# Patient Record
Sex: Male | Born: 1985
Health system: Southern US, Community
[De-identification: ages and names within clinical notes are randomized; demographics above are authoritative.]

## PROBLEM LIST (undated history)

## (undated) DIAGNOSIS — E785 Hyperlipidemia, unspecified: Secondary | ICD-10-CM

## (undated) DIAGNOSIS — E119 Type 2 diabetes mellitus without complications: Secondary | ICD-10-CM

## (undated) DIAGNOSIS — T7840XA Allergy, unspecified, initial encounter: Secondary | ICD-10-CM

## (undated) HISTORY — DX: Hyperlipidemia, unspecified: E78.5

## (undated) HISTORY — DX: Allergy, unspecified, initial encounter: T78.40XA

---
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
## 2006-03-07 ENCOUNTER — Ambulatory Visit: Payer: Self-pay | Admitting: Pediatrics

## 2010-04-08 ENCOUNTER — Ambulatory Visit
Admission: RE | Admit: 2010-04-08 | Discharge: 2010-04-08 | Disposition: A | Discharge status: Home / Self Care | Payer: No Typology Code available for payment source | Source: Ambulatory Visit | Attending: Occupational Medicine | Admitting: Occupational Medicine

## 2010-04-08 ENCOUNTER — Other Ambulatory Visit: Payer: Self-pay | Admitting: Occupational Medicine

## 2010-04-08 DIAGNOSIS — Z021 Encounter for pre-employment examination: Secondary | ICD-10-CM

## 2010-11-15 ENCOUNTER — Other Ambulatory Visit: Payer: Self-pay | Admitting: Occupational Medicine

## 2010-11-15 ENCOUNTER — Ambulatory Visit: Payer: Self-pay

## 2010-11-15 DIAGNOSIS — R52 Pain, unspecified: Secondary | ICD-10-CM

## 2012-03-26 ENCOUNTER — Encounter (HOSPITAL_COMMUNITY): Payer: Self-pay | Admitting: Emergency Medicine

## 2012-03-26 ENCOUNTER — Emergency Department (HOSPITAL_COMMUNITY)
Admission: EM | Admit: 2012-03-26 | Discharge: 2012-03-26 | Disposition: A | Discharge status: Home / Self Care | Payer: Worker's Compensation | Attending: Emergency Medicine | Admitting: Emergency Medicine

## 2012-03-26 DIAGNOSIS — Y9289 Other specified places as the place of occurrence of the external cause: Secondary | ICD-10-CM | POA: Insufficient documentation

## 2012-03-26 DIAGNOSIS — S0093XA Contusion of unspecified part of head, initial encounter: Secondary | ICD-10-CM

## 2012-03-26 DIAGNOSIS — W009XXA Unspecified fall due to ice and snow, initial encounter: Secondary | ICD-10-CM

## 2012-03-26 DIAGNOSIS — S1093XA Contusion of unspecified part of neck, initial encounter: Secondary | ICD-10-CM | POA: Insufficient documentation

## 2012-03-26 DIAGNOSIS — W010XXA Fall on same level from slipping, tripping and stumbling without subsequent striking against object, initial encounter: Secondary | ICD-10-CM | POA: Insufficient documentation

## 2012-03-26 DIAGNOSIS — Y9389 Activity, other specified: Secondary | ICD-10-CM | POA: Insufficient documentation

## 2012-03-26 DIAGNOSIS — S0003XA Contusion of scalp, initial encounter: Secondary | ICD-10-CM | POA: Insufficient documentation

## 2012-03-26 DIAGNOSIS — S0083XA Contusion of other part of head, initial encounter: Secondary | ICD-10-CM | POA: Insufficient documentation

## 2012-03-26 DIAGNOSIS — Y990 Civilian activity done for income or pay: Secondary | ICD-10-CM | POA: Insufficient documentation

## 2012-03-26 NOTE — ED Notes (Signed)
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

## 2012-03-26 NOTE — ED Provider Notes (Signed)
History   This chart was scribed for non-physician practitioner working with Francisco Andrews, by Gerlean Ren, ED Scribe. This patient was seen in room TR07C/TR07C and the patient's care was started at 7:56 PM.    CSN: [id number]  Arrival date & time 03/26/12  1946   None     No chief complaint on file.    The history is provided by the patient. No language interpreter was used.   Mason Dibiasio is a 27 y.o. male who presents to the Emergency Department complaining of constant, non-changing, non-radiating aching HA and posterior head soreness after slipping and falling backwards making head impact with pavement while working.  Pt states he did not lose consciousness but he "saw a bright light" at the moment of impact but denies any lasting visual disturbances.  Pt states pain is bearable and less severe than he is used to with h/o migraines.  Pt denies neck pain, shoulder pain, back pain.  Pt denies tobacco and alcohol use.  No past medical history on file.  No past surgical history on file.  No family history on file.  History  Substance Use Topics  . Smoking status: Never Smoker   . Smokeless tobacco: Not on file  . Alcohol Use: No      Review of Systems  HENT: Negative for neck pain.   Eyes: Negative for visual disturbance.  Gastrointestinal: Negative for nausea and vomiting.  Musculoskeletal: Negative for back pain.  Neurological: Positive for headaches.  All other systems reviewed and are negative.    Allergies  Review of patient's allergies indicates not on file.  Home Medications  No current outpatient prescriptions on file.  BP 166/88  Pulse 101  Temp 98.7 F (37.1 C) (Oral)  Resp 20  SpO2 96%  Physical Exam  Nursing note and vitals reviewed. Constitutional: He is oriented to person, place, and time. He appears well-developed and well-nourished. No distress.  HENT:  Head: Normocephalic and atraumatic.       No contusions, no swelling, no  noticeable injury  Eyes: EOM are normal. Pupils are equal, round, and reactive to light.  Neck: Neck supple. No tracheal deviation present.  Cardiovascular: Normal rate, regular rhythm and normal heart sounds.   Pulmonary/Chest: Effort normal and breath sounds normal. No respiratory distress. He has no wheezes.  Musculoskeletal: Normal range of motion.  Neurological: He is alert and oriented to person, place, and time. No cranial nerve deficit.  Skin: Skin is warm and dry.  Psychiatric: He has a normal mood and affect. His behavior is normal.    ED Course  Procedures (including critical care time) DIAGNOSTIC STUDIES: Oxygen Saturation is 96% on room air, adequate by my interpretation.    COORDINATION OF CARE: 8:01 PM- Informed pt to return if HA worsens or if nausea/emesis begin.  Discussed discharge.  Pt understands and agrees.  No diagnosis found.  Slipped, fell on ice, landing on back (wearing ballistic vest), struck head on pavement.  No loss of consciousness.  Neuro exam grossly normal.  Mild occipital headache, no obvious external injury noted.  Discharged home with head injury and return precautions.  MDM    I personally performed the services described in this documentation, which was scribed in my presence. The recorded information has been reviewed and is accurate.        Jimmye Norman, NP 03/26/12 2017

## 2012-03-26 NOTE — ED Notes (Addendum)
NP Francisco Andrews at bedside. C Collar removed by NP. Pt denies neck pain. Reports 8/10 HA. Denies blurred vision. Denies LOC. No deformity or trauma noted to back of head.

## 2012-03-26 NOTE — ED Provider Notes (Signed)
Medical screening examination/treatment/procedure(s) were performed by non-physician practitioner and as supervising physician I was immediately available for consultation/collaboration.   Gilda Crease, MD 03/26/12 [phone]

## 2013-05-08 ENCOUNTER — Encounter (HOSPITAL_COMMUNITY): Payer: Self-pay | Admitting: Emergency Medicine

## 2013-05-08 ENCOUNTER — Emergency Department (INDEPENDENT_AMBULATORY_CARE_PROVIDER_SITE_OTHER)
Admission: EM | Admit: 2013-05-08 | Discharge: 2013-05-08 | Disposition: A | Discharge status: Home / Self Care | Payer: 59 | Source: Home / Self Care | Attending: Family Medicine | Admitting: Family Medicine

## 2013-05-08 DIAGNOSIS — S61219A Laceration without foreign body of unspecified finger without damage to nail, initial encounter: Secondary | ICD-10-CM

## 2013-05-08 DIAGNOSIS — S61209A Unspecified open wound of unspecified finger without damage to nail, initial encounter: Secondary | ICD-10-CM | POA: Diagnosis not present

## 2013-05-08 NOTE — ED Notes (Signed)
PA notified that I did not see order for recheck of BP, until after pt. had left.  She said it was OK.

## 2013-05-08 NOTE — ED Provider Notes (Signed)
Medical screening examination/treatment/procedure(s) were performed by resident physician or non-physician practitioner and as supervising physician I was immediately available for consultation/collaboration.   KINDL,JAMES DOUGLAS MD.   James D Kindl, MD 05/08/13 2047 

## 2013-05-08 NOTE — Discharge Instructions (Signed)
Tissue Adhesive Wound Care °Some cuts, wounds, lacerations, and incisions can be repaired by using tissue adhesive. Tissue adhesive is like glue. It holds the skin together, allowing for faster healing. It forms a strong bond on the skin in about 1 minute and reaches its full strength in about 2 or 3 minutes. The adhesive disappears naturally while the wound is healing. It is important to take proper care of your wound at home while it heals.  °HOME CARE INSTRUCTIONS  °· Showers are allowed. Do not soak the area containing the tissue adhesive. Do not take baths, swim, or use hot tubs. Do not use any soaps or ointments on the wound. Certain ointments can weaken the glue. °· If a bandage (dressing) has been applied, follow your health care provider's instructions for how often to change the dressing.   °· Keep the dressing dry if one has been applied.   °· Do not scratch, pick, or rub the adhesive.   °· Do not place tape over the adhesive. The adhesive could come off when pulling the tape off.   °· Protect the wound from further injury until it is healed.   °· Protect the wound from sun and tanning bed exposure while it is healing and for several weeks after healing.   °· Only take over-the-counter or prescription medicines as directed by your health care provider.   °· Keep all follow-up appointments as directed by your health care provider. °SEEK IMMEDIATE MEDICAL CARE IF:  °· Your wound becomes red, swollen, hot, or tender.   °· You develop a rash after the glue is applied. °· You have increasing pain in the wound.   °· You have a red streak that goes away from the wound.   °· You have pus coming from the wound.   °· You have increased bleeding. °· You have a fever. °· You have shaking chills.   °· You notice a bad smell coming from the wound.   °· Your wound or adhesive breaks open.   °MAKE SURE YOU:  °· Understand these instructions. °· Will watch your condition. °· Will get help right away if you are not doing  well or get worse. °Document Released: 08/09/2000 Document Revised: 12/04/2012 Document Reviewed: 09/04/2012 °Exit[employer]® Patient Information ©2014 [employer]. ° °
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
## 2013-05-08 NOTE — ED Provider Notes (Signed)
CSN: [id number]     Arrival date & time 05/08/13  1929 History   First MD Initiated Contact with Patient 05/08/13 1944     Chief Complaint  Patient presents with  . Extremity Laceration   (Consider location/radiation/quality/duration/timing/severity/associated sxs/prior Treatment) Patient is a 28 y.o. male presenting with skin laceration. The history is provided by the patient. No language interpreter was used.  Laceration Location:  Hand Hand laceration location:  R finger Length (cm):  0.3 Depth:  Through dermis Laceration mechanism:  Metal edge Pain details:    Quality:  Aching   Severity:  No pain Foreign body present:  No foreign bodies Relieved by:  Pressure Worsened by:  Nothing tried Ineffective treatments:  None tried Tetanus status:  Up to date   Past Medical History  Diagnosis Date  . Hypertension    History reviewed. No pertinent past surgical history. History reviewed. No pertinent family history. History  Substance Use Topics  . Smoking status: Current Some Day Smoker    Types: Cigars  . Smokeless tobacco: Not on file  . Alcohol Use: No    Review of Systems  Skin: Positive for wound.  All other systems reviewed and are negative.    Allergies  Biaxin; Penicillins; and Sulfa antibiotics  Home Medications   Current Outpatient Rx  Name  Route  Sig  Dispense  Refill  . fexofenadine (ALLEGRA) 180 MG tablet   Oral   Take 180 mg by mouth daily.          BP 160/101  Pulse 115  Temp(Src) 98 F (36.7 C) (Oral)  Resp 18  SpO2 96% Physical Exam  Nursing note and vitals reviewed. Constitutional: He is oriented to person, place, and time. He appears well-developed and well-nourished.  Musculoskeletal:  3mm superficial laceration   Neurological: He is alert and oriented to person, place, and time. He has normal reflexes.  Skin: Skin is warm.  Psychiatric: He has a normal mood and affect.    ED Course  LACERATION REPAIR Date/Time: 05/08/2013  8:24 PM Performed by: Cheron SchaumannSOFIA, Evalie Hargraves K Authorized by: Clementeen GrahamOREY, EVAN, S Risks and benefits: risks, benefits and alternatives were discussed Required items: required blood products, implants, devices, and special equipment available Body area: upper extremity Location details: right index finger Laceration length: 0.3 cm Foreign bodies: no foreign bodies Tendon involvement: none Nerve involvement: none Skin closure: glue Patient tolerance: Patient tolerated the procedure well with no immediate complications.   (including critical care time) Labs Review Labs Reviewed - No data to display Imaging Review No results found.   MDM   1. Laceration of finger       Elson AreasLeslie K Krystalynn Ridgeway, [place] JerseyPA-C 05/08/13 2028

## 2013-07-21 ENCOUNTER — Encounter (HOSPITAL_COMMUNITY): Payer: Self-pay | Admitting: Emergency Medicine

## 2013-07-21 ENCOUNTER — Emergency Department (HOSPITAL_COMMUNITY)
Admission: EM | Admit: 2013-07-21 | Discharge: 2013-07-21 | Disposition: A | Discharge status: Home / Self Care | Payer: Worker's Compensation | Attending: Emergency Medicine | Admitting: Emergency Medicine

## 2013-07-21 DIAGNOSIS — Y9389 Activity, other specified: Secondary | ICD-10-CM | POA: Insufficient documentation

## 2013-07-21 DIAGNOSIS — T5894XA Toxic effect of carbon monoxide from unspecified source, undetermined, initial encounter: Secondary | ICD-10-CM | POA: Insufficient documentation

## 2013-07-21 DIAGNOSIS — F17200 Nicotine dependence, unspecified, uncomplicated: Secondary | ICD-10-CM | POA: Insufficient documentation

## 2013-07-21 DIAGNOSIS — I10 Essential (primary) hypertension: Secondary | ICD-10-CM | POA: Insufficient documentation

## 2013-07-21 DIAGNOSIS — Z880 Allergy status to penicillin: Secondary | ICD-10-CM | POA: Insufficient documentation

## 2013-07-21 DIAGNOSIS — Z7729 Contact with and (suspected ) exposure to other hazardous substances: Secondary | ICD-10-CM

## 2013-07-21 DIAGNOSIS — Y9289 Other specified places as the place of occurrence of the external cause: Secondary | ICD-10-CM | POA: Insufficient documentation

## 2013-07-21 DIAGNOSIS — T5801XA Toxic effect of carbon monoxide from motor vehicle exhaust, accidental (unintentional), initial encounter: Secondary | ICD-10-CM | POA: Insufficient documentation

## 2013-07-21 DIAGNOSIS — Z79899 Other long term (current) drug therapy: Secondary | ICD-10-CM | POA: Insufficient documentation

## 2013-07-21 LAB — CARBOXYHEMOGLOBIN
Carboxyhemoglobin: 1.5 % (ref 0.5–1.5)
Methemoglobin: 1 % (ref 0.0–1.5)
O2 Saturation: 98.4 %
Total hemoglobin: 15.9 g/dL (ref 13.5–18.0)
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
## 2013-07-21 NOTE — ED Provider Notes (Signed)
Medical screening examination/treatment/procedure(s) were performed by non-physician practitioner and as supervising physician I was immediately available for consultation/collaboration.     Kalief Kattner, MD 07/21/13 0636 
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
## 2013-07-21 NOTE — Discharge Instructions (Signed)
Carbon Monoxide Poisoning Carbon monoxide poisoning is sickness from breathing in carbon monoxide gas. This is an emergency. You need to get medical help right away.  HOME CARE Do not return to the area where you breathed in the gas. The area must be completely aired out (ventilated) before it is safe to return. To prevent sickness in the future:  Put a carbon monoxide detector in your home.  Have all gas stoves and furnaces checked once a year.  Clean all fireplace chimneys and flues at least once a year.  Have the exhaust system in your car checked once a year.  Never keep a car's motor running in a closed garage.  Never sleep in a car with the motor running.  Make sure all rooms that are heated with gas, coal, charcoal, or wood are aired out well. GET HELP RIGHT AWAY IF: You think a person has breathed in carbon monoxide gas. Remove the person from the area right away. Call your local emergency services (911 in [place] Begin rescue breathing if you cannot wake up the person (unconscious). MAKE SURE YOU:  Understand these instructions.  Will watch your condition.  Will get help right away if you are not doing well or get worse. Document Released: 08/15/2011 Document Reviewed: 08/15/2011 [facility] Patient Information 2014 [place], [place].

## 2017-05-20 ENCOUNTER — Emergency Department (HOSPITAL_COMMUNITY)
Admission: EM | Admit: 2017-05-20 | Discharge: 2017-05-20 | Disposition: A | Discharge status: Home / Self Care | Payer: Worker's Compensation | Attending: Emergency Medicine | Admitting: Emergency Medicine

## 2017-05-20 ENCOUNTER — Emergency Department (HOSPITAL_COMMUNITY): Payer: Worker's Compensation

## 2017-05-20 DIAGNOSIS — S6991XA Unspecified injury of right wrist, hand and finger(s), initial encounter: Secondary | ICD-10-CM | POA: Diagnosis present

## 2017-05-20 DIAGNOSIS — Y999 Unspecified external cause status: Secondary | ICD-10-CM | POA: Insufficient documentation

## 2017-05-20 DIAGNOSIS — S60221A Contusion of right hand, initial encounter: Secondary | ICD-10-CM | POA: Diagnosis not present

## 2017-05-20 DIAGNOSIS — Y929 Unspecified place or not applicable: Secondary | ICD-10-CM | POA: Insufficient documentation

## 2017-05-20 DIAGNOSIS — Y9389 Activity, other specified: Secondary | ICD-10-CM | POA: Insufficient documentation

## 2017-05-20 DIAGNOSIS — W228XXA Striking against or struck by other objects, initial encounter: Secondary | ICD-10-CM | POA: Insufficient documentation

## 2017-05-20 NOTE — ED Notes (Signed)
XR at bedside
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
## 2017-05-20 NOTE — Discharge Instructions (Signed)
It was my pleasure taking care of you today!   Fortunately, your x-ray was negative today.  Alternate between Tylenol and ibuprofen as needed for pain.  Ice to affected area can help with pain as well.  Follow-up with the hand surgeon if symptoms are not improving in 1 week.  Return to ER for new or worsening symptoms, any additional concerns.

## 2017-09-24 ENCOUNTER — Encounter (HOSPITAL_COMMUNITY): Payer: Self-pay | Admitting: *Deleted

## 2017-09-24 ENCOUNTER — Other Ambulatory Visit: Payer: Self-pay

## 2017-09-24 ENCOUNTER — Emergency Department (HOSPITAL_COMMUNITY)
Admission: EM | Admit: 2017-09-24 | Discharge: 2017-09-24 | Disposition: A | Discharge status: Home / Self Care | Payer: 59 | Attending: Emergency Medicine | Admitting: Emergency Medicine

## 2017-09-24 DIAGNOSIS — R739 Hyperglycemia, unspecified: Secondary | ICD-10-CM | POA: Insufficient documentation

## 2017-09-24 DIAGNOSIS — Z7982 Long term (current) use of aspirin: Secondary | ICD-10-CM | POA: Diagnosis not present

## 2017-09-24 DIAGNOSIS — R0902 Hypoxemia: Secondary | ICD-10-CM | POA: Diagnosis not present

## 2017-09-24 DIAGNOSIS — G43009 Migraine without aura, not intractable, without status migrainosus: Secondary | ICD-10-CM | POA: Diagnosis not present

## 2017-09-24 DIAGNOSIS — F17200 Nicotine dependence, unspecified, uncomplicated: Secondary | ICD-10-CM | POA: Insufficient documentation

## 2017-09-24 DIAGNOSIS — R110 Nausea: Secondary | ICD-10-CM | POA: Diagnosis not present

## 2017-09-24 DIAGNOSIS — R000 Tachycardia, unspecified: Secondary | ICD-10-CM | POA: Diagnosis not present

## 2017-09-24 DIAGNOSIS — G4489 Other headache syndrome: Secondary | ICD-10-CM | POA: Diagnosis not present

## 2017-09-24 DIAGNOSIS — R51 Headache: Secondary | ICD-10-CM | POA: Diagnosis present

## 2017-09-24 LAB — CBC WITH DIFFERENTIAL/PLATELET
Basophils Absolute: 0.1 10*3/uL (ref 0.0–0.1)
Basophils Relative: 1 %
Eosinophils Absolute: 0.1 10*3/uL (ref 0.0–0.7)
Eosinophils Relative: 1 %
HCT: 49.2 % (ref 39.0–52.0)
Hemoglobin: 17.8 g/dL — ABNORMAL HIGH (ref 13.0–17.0)
Lymphocytes Relative: 31 %
Lymphs Abs: 2.3 10*3/uL (ref 0.7–4.0)
MCH: 31 pg (ref 26.0–34.0)
MCHC: 36.2 g/dL — ABNORMAL HIGH (ref 30.0–36.0)
MCV: 85.6 fL (ref 78.0–100.0)
Monocytes Absolute: 0.4 10*3/uL (ref 0.1–1.0)
Monocytes Relative: 6 %
Neutro Abs: 4.5 10*3/uL (ref 1.7–7.7)
Neutrophils Relative %: 61 %
Platelets: 273 10*3/uL (ref 150–400)
RBC: 5.75 MIL/uL (ref 4.22–5.81)
RDW: 11.8 % (ref 11.5–15.5)
WBC: 7.3 10*3/uL (ref 4.0–10.5)

## 2017-09-24 LAB — BASIC METABOLIC PANEL
Anion gap: 15 (ref 5–15)
Anion gap: 11 (ref 5–15)
BUN: 13 mg/dL (ref 6–20)
BUN: 12 mg/dL (ref 6–20)
CO2: 27 mmol/L (ref 22–32)
CO2: 25 mmol/L (ref 22–32)
Calcium: 9.7 mg/dL (ref 8.9–10.3)
Calcium: 8.2 mg/dL — ABNORMAL LOW (ref 8.9–10.3)
Chloride: 94 mmol/L — ABNORMAL LOW (ref 98–111)
Chloride: 101 mmol/L (ref 98–111)
Creatinine, Ser: 0.79 mg/dL (ref 0.61–1.24)
Creatinine, Ser: 0.69 mg/dL (ref 0.61–1.24)
GFR calc Af Amer: >60 mL/min (ref >60)
GFR calc Af Amer: >60 mL/min (ref >60)
GFR calc non Af Amer: >60 mL/min (ref >60)
GFR calc non Af Amer: >60 mL/min (ref >60)
Glucose, Bld: 372 mg/dL — ABNORMAL HIGH (ref 70–99)
Glucose, Bld: 294 mg/dL — ABNORMAL HIGH (ref 70–99)
Potassium: 4.5 mmol/L (ref 3.5–5.1)
Potassium: 3.8 mmol/L (ref 3.5–5.1)
Sodium: 136 mmol/L (ref 135–145)
Sodium: 137 mmol/L (ref 135–145)

## 2017-09-24 LAB — URINALYSIS, ROUTINE W REFLEX MICROSCOPIC
Bacteria, UA: NONE SEEN
Bilirubin Urine: NEGATIVE
Glucose, UA: >=500 mg/dL — AB
Hgb urine dipstick: NEGATIVE
Ketones, ur: 20 mg/dL — AB
Leukocytes, UA: NEGATIVE
Nitrite: NEGATIVE
Protein, ur: NEGATIVE mg/dL
Specific Gravity, Urine: 1.039 — ABNORMAL HIGH (ref 1.005–1.030)
pH: 6 (ref 5.0–8.0)

## 2017-09-24 LAB — CBG MONITORING, ED: Glucose-Capillary: 261 mg/dL — ABNORMAL HIGH (ref 70–99)

## 2017-09-24 MED ORDER — PROCHLORPERAZINE EDISYLATE 10 MG/2ML IJ SOLN: 10.0000 mg | Freq: Once | INTRAMUSCULAR | Status: DC

## 2017-09-24 MED ORDER — KETOROLAC TROMETHAMINE 30 MG/ML IJ SOLN
30.0000 mg | Freq: Once | INTRAMUSCULAR | Status: AC
  Administered 2017-09-24: 30 mg via INTRAVENOUS
  Filled 2017-09-24: qty 1

## 2017-09-24 MED ORDER — SODIUM CHLORIDE 0.9 % IV BOLUS
1000.0000 mL | Freq: Once | INTRAVENOUS | Status: AC
  Administered 2017-09-24: 1000 mL via INTRAVENOUS

## 2017-09-24 MED ORDER — DIPHENHYDRAMINE HCL 50 MG/ML IJ SOLN: 25.0000 mg | Freq: Once | INTRAMUSCULAR | Status: DC

## 2017-09-24 MED ORDER — METFORMIN HCL 1000 MG PO TABS: 500.0000 mg | ORAL_TABLET | Freq: Two times a day (BID) | ORAL | 0 refills | Status: DC

## 2017-09-24 NOTE — Discharge Instructions (Signed)
Take metformin twice a day as directed. You will need to establish with a primary care provider for repeat labs and monitoring.  He can follow-up with the wellness center listed below. Return to ED for worsening symptoms, severe chest pain or headache, vision changes, increased thirst or urination, persistent vomiting.

## 2017-09-24 NOTE — ED Notes (Signed)
While in room, HR alarm went off. Patient noted to be in ST. EKG rhythm printed. Shown to MD.

## 2017-09-24 NOTE — ED Triage Notes (Signed)
Pt transported by [facility].  Reports migraine h/a which started yesterday with n/v.  Vomited x 1 today.  Pt reports pain is behind his right eye.  Paramedic reported pt's BP was 167/100 on scene.  Pt's colleague at bedside reports pt was at work, he was diaphoretic with elevated BP and tachycardic.  Pt is A&Ox 4.  Pt ambulatory without difficulty.  Denies unilateral weakness, facial droop, and slurred speech.  Pt also reports photosensitivity.

## 2017-09-24 NOTE — ED Provider Notes (Signed)
[facility]-EMERGENCY DEPT Provider Note   CSN: [id number] Arrival date & time: 09/24/17  1541     History   Chief Complaint Chief Complaint  Patient presents with  . Headache  . Nausea    HPI Francisco Andrews is a 32 y.o. male who presents to ED for evaluation of headache since last night.  He reports associated photophobia, phonophobia, nausea and nonbloody, nonbilious emesis.  States that it began last night.  He took 1 dose of Pepto-Bismol and Excedrin which he usually takes for his headache with improvement in his symptoms.  He woke up this morning with improvement in his symptoms.  However, when he got to work he became diaphoretic with continued nausea and headache.  States that his colleague checked his blood pressure and it was elevated to 145/105.  He was given 1 bag of fluids IV.  States that since he is arrived in the ED, his pain has improved to now a 2/10 from a previous 6/10.  States that he will get similar headaches in the past which usually resolve with Excedrin and sleeping.  Denies any numbness in arms or legs, headache, neck pain, fever, chest pain, shortness of breath.  He denies any family or personal history of aneurysms.  Reports family history of hypertension but has never been on any antihypertensives himself.  He admits that he has not seen a primary care provider ever.  HPI  History reviewed. No pertinent past medical history.  There are no active problems to display for this patient.   History reviewed. No pertinent surgical history.      Home Medications    Prior to Admission medications   Medication Sig Start Date End Date Taking? Authorizing Provider  aspirin-acetaminophen-caffeine (EXCEDRIN MIGRAINE) [phone] MG tablet Take 1 tablet by mouth at bedtime as needed for migraine.   Yes [provider]  metFORMIN (GLUCOPHAGE) 1000 MG tablet Take 0.5 tablets (500 mg total) by mouth 2 (two) times daily for 15 days.  09/24/17 10/09/17  Dietrich Pates, PA-C    Family History No family history on file.  Social History Social History   Tobacco Use  . Smoking status: Current Every Day Smoker  . Smokeless tobacco: Never Used  . Tobacco comment: chews tobacco  Substance Use Topics  . Alcohol use: Never    Frequency: Never  . Drug use: Never     Allergies   Biaxin [clarithromycin]; Penicillins; and Sulfa antibiotics   Review of Systems Review of Systems  Constitutional: Negative for appetite change, chills and fever.  HENT: Negative for ear pain, rhinorrhea, sneezing and sore throat.   Eyes: Negative for photophobia and visual disturbance.  Respiratory: Negative for cough, chest tightness, shortness of breath and wheezing.   Cardiovascular: Negative for chest pain and palpitations.  Gastrointestinal: Positive for nausea and vomiting. Negative for abdominal pain, blood in stool, constipation and diarrhea.  Genitourinary: Negative for dysuria, hematuria and urgency.  Musculoskeletal: Negative for myalgias.  Skin: Negative for rash.  Neurological: Positive for headaches. Negative for dizziness, weakness and light-headedness.     Physical Exam Updated Vital Signs BP 135/75   Pulse 87   Temp 98 F (36.7 C) (Oral)   Resp 15   Ht 6' (1.829 m)   Wt 104.3 kg (230 lb)   SpO2 96%   BMI 31.19 kg/m   Physical Exam  Constitutional: He is oriented to person, place, and time. He appears well-developed and well-nourished. No distress.  HENT:  Head:  Normocephalic and atraumatic.  Nose: Nose normal.  Eyes: Pupils are equal, round, and reactive to light. Conjunctivae and EOM are normal. Right eye exhibits no discharge. Left eye exhibits no discharge. No scleral icterus.  Neck: Normal range of motion. Neck supple.  No meningismus.  Cardiovascular: Normal rate, regular rhythm, normal heart sounds and intact distal pulses. Exam reveals no gallop and no friction rub.  No murmur heard. Pulmonary/Chest:  Effort normal and breath sounds normal. No respiratory distress.  Abdominal: Soft. Bowel sounds are normal. He exhibits no distension. There is no tenderness. There is no guarding.  Musculoskeletal: Normal range of motion. He exhibits no edema.  Neurological: He is alert and oriented to person, place, and time. No cranial nerve deficit or sensory deficit. He exhibits normal muscle tone. Coordination normal.  Pupils reactive. No facial asymmetry noted. Cranial nerves appear grossly intact. Sensation intact to light touch on face, BUE and BLE. Strength 5/5 in BUE and BLE. Normal finger to nose coordination bilaterally.  Skin: Skin is warm and dry. No rash noted.  Psychiatric: He has a normal mood and affect.  Nursing note and vitals reviewed.    ED Treatments / Results  Labs (all labs ordered are listed, but only abnormal results are displayed) Labs Reviewed  BASIC METABOLIC PANEL - Abnormal; Notable for the following components:      Result Value   Chloride 94 (*)    Glucose, Bld 372 (*)    All other components within normal limits  CBC WITH DIFFERENTIAL/PLATELET - Abnormal; Notable for the following components:   Hemoglobin 17.8 (*)    MCHC 36.2 (*)    All other components within normal limits  URINALYSIS, ROUTINE W REFLEX MICROSCOPIC - Abnormal; Notable for the following components:   Specific Gravity, Urine 1.039 (*)    Glucose, UA >=500 (*)    Ketones, ur 20 (*)    All other components within normal limits  BASIC METABOLIC PANEL - Abnormal; Notable for the following components:   Glucose, Bld 294 (*)    Calcium 8.2 (*)    All other components within normal limits  CBG MONITORING, ED - Abnormal; Notable for the following components:   Glucose-Capillary 261 (*)    All other components within normal limits    EKG None  Radiology No results found.  Procedures Procedures (including critical care time)  Medications Ordered in ED Medications  sodium chloride 0.9 % bolus  1,000 mL (0 mLs Intravenous Stopped 09/24/17 1723)  ketorolac (TORADOL) 30 MG/ML injection 30 mg (30 mg Intravenous Given 09/24/17 1622)  sodium chloride 0.9 % bolus 1,000 mL (0 mLs Intravenous Stopped 09/24/17 1828)     Initial Impression / Assessment and Plan / ED Course  I have reviewed the triage vital signs and the nursing notes.  Pertinent labs & imaging results that were available during my care of the patient were reviewed by me and considered in my medical decision making (see chart for details).     32 year old male presents to ED for evaluation of migraine headache with associated nausea, vomiting that began yesterday.  Improvement with Pepto-Bismol and Excedrin taken last night but states that the symptoms got worse today while at work.  Coworkers noted that he was diaphoretic and was hypertensive.  On arrival to the ED after he received a fluid bolus at work, he states that his headache has improved.  Does have a history of migraines in the past several times a year.  Denies any personal family  history of aneurysms.  He is afebrile.  No meningismus noted.  No deficits on neurological exam noted.  CBC unremarkable.  BMP showed initial glucose of 372 with an anion gap of 15.  Patient states that last meal was approximately 5-hour prior to lab draw.  Given 2 L of fluid with improvement in his glucose and anion gap.  Urine did show ketones and glucose.  Patient denies any polyuria polydipsia.  He has not seen a primary care provider in several years but does have a family history of diabetes.  Toradol given with improvement in headache.  States that his symptoms have completely resolved.  He continues to be in NAD.  Will begin patient on 500 mg metformin twice a day and encouraged him to follow-up and establish with a primary care provider.  Patient is agreeable with this plan.  No need for head imaging at this time. There are no headache characteristics that are lateralizing or concerning for  increased ICP, infectious or vascular cause of his symptoms.   Advised to return to ED for any severe worsening symptoms.  Portions of this note were generated with Scientist, clinical (histocompatibility and immunogenetics). Dictation errors may occur despite best attempts at proofreading.   Final Clinical Impressions(s) / ED Diagnoses   Final diagnoses:  Migraine without aura and without status migrainosus, not intractable  Hyperglycemia    ED Discharge Orders        Ordered    metFORMIN (GLUCOPHAGE) 1000 MG tablet  2 times daily     09/24/17 1957       Dietrich Pates, PA-C 09/24/17 2000    Charlynne Pander, MD 09/24/17 [phone]

## 2017-09-25 ENCOUNTER — Encounter (HOSPITAL_COMMUNITY): Payer: Self-pay | Admitting: *Deleted

## 2017-09-26 ENCOUNTER — Telehealth: Payer: Self-pay | Admitting: Family Medicine

## 2017-09-26 NOTE — Telephone Encounter (Signed)
Appointment scheduled 10/05/17

## 2017-09-26 NOTE — Telephone Encounter (Signed)
-----   Message from Thalia Bloodgooderesa N Presnell sent at 09/25/2017  8:15 AM EDT ----- Regarding: New Pt request Contact: [phone] Argel's mom Jasmine DecemberSharon is a pt of Dr. Sherrie MustacheFisher. 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 Verdon CumminsJesse hasn't had a PCP in awhile and Jasmine DecemberSharon is requesting Dr. Sherrie MustacheFisher accept him as a new pt. Jasmine DecemberSharon is requesting that if Dr. Sherrie MustacheFisher can accept him that he be seen as soon as possible to F/U from ER. Can we establish as new pt and if so when can he be seen? Please advise. Thanks TNP

## 2017-10-05 ENCOUNTER — Encounter: Payer: Self-pay | Admitting: Family Medicine

## 2017-10-05 ENCOUNTER — Ambulatory Visit (INDEPENDENT_AMBULATORY_CARE_PROVIDER_SITE_OTHER): Payer: 59 | Admitting: Family Medicine

## 2017-10-05 VITALS — BP 125/77 | HR 81 | Temp 98.3°F | Resp 18 | Ht 72.0 in | Wt 221.0 lb

## 2017-10-05 DIAGNOSIS — R945 Abnormal results of liver function studies: Secondary | ICD-10-CM | POA: Diagnosis not present

## 2017-10-05 DIAGNOSIS — R739 Hyperglycemia, unspecified: Secondary | ICD-10-CM

## 2017-10-05 LAB — POCT GLYCOSYLATED HEMOGLOBIN (HGB A1C)
Est. average glucose Bld gHb Est-mCnc: >326
Hemoglobin A1C: 13.5 % — AB (ref 4.0–5.6)

## 2017-10-05 LAB — GLUCOSE, POCT (MANUAL RESULT ENTRY): POC Glucose: 191 mg/dl — AB (ref 70–99)

## 2017-10-05 NOTE — Progress Notes (Signed)
Patient: Francisco Andrews Male    DOB: 1986/01/24   32 y.o.   MRN: [id number] Visit Date: 10/05/2017  Today's Provider: Mila Merryonald Jamaurie Bernier, MD   Chief Complaint  Patient presents with  . Establish Care  . Follow-up   Subjective:    HPI  New patient Establishing Care: Patient is here today as a new patient establishing care. Patient has no previous PCP.    Follow up ER visit  Patient was seen in ER for Migraine and Hyperglycemia on 09/24/2017. He was treated for Migraine and Hyperglycemia Treatment for this included; started on metformin 500 mg bid. He reports good compliance with treatment. He reports this condition is Improved.  CMP Latest Ref Rng & Units 09/24/2017 09/24/2017  Glucose 70 - 99 mg/dL 914(N294(H) 829(F372(H)  BUN 6 - 20 mg/dL 12 13  Creatinine 6.210.61 - 1.24 mg/dL 3.080.69 6.570.79  Sodium 846135 - 145 mmol/L 137 136  Potassium 3.5 - 5.1 mmol/L 3.8 4.5  Chloride 98 - 111 mmol/L 101 94(L)  CO2 22 - 32 mmol/L 25 27  Calcium 8.9 - 10.3 mg/dL 8.2(L) 9.7   He states he had been feeling sluggish for awhile before his ER visit. He thinks the headache was related to his overall malaise, but has been feeling much better,  With no headaches, and much better energy levels since starting metformin.  ------------------------------------------------------------------------------------    Allergies  Allergen Reactions  . Biaxin [Clarithromycin] Hives and Itching  . Biaxin [Clarithromycin] Hives  . Penicillins Hives and Itching  . Penicillins Hives    Has patient had a PCN reaction causing immediate rash, facial/tongue/throat swelling, SOB or lightheadedness with hypotension: /No/ Has patient had a PCN reaction causing severe rash involving mucus membranes or skin necrosis: /No/ Has patient had a PCN reaction that required hospitalization: No/ Has patient had a PCN reaction occurring within the last 10 years: No/ If all of the above answers are "NO", then may proceed with Cephalosporin  use.   . Sulfa Antibiotics Hives, Itching and Swelling    Swelling in throat  . Sulfa Antibiotics Hives     Current Outpatient Medications:  .  aspirin-acetaminophen-caffeine (EXCEDRIN MIGRAINE) 250-250-65 MG tablet, Take 1 tablet by mouth at bedtime as needed for migraine., Disp: , Rfl:  .  fexofenadine (ALLEGRA) 180 MG tablet, Take 180 mg by mouth daily as needed for allergies. , Disp: , Rfl:  .  ibuprofen (ADVIL,MOTRIN) 200 MG tablet, Take 400-800 mg by mouth every 6 (six) hours as needed for moderate pain., Disp: , Rfl:  .  metFORMIN (GLUCOPHAGE) 1000 MG tablet, Take 0.5 tablets (500 mg total) by mouth 2 (two) times daily for 15 days., Disp: 15 tablet, Rfl: 0  Review of Systems  Constitutional: Negative for appetite change, chills and fever.  Respiratory: Negative for chest tightness, shortness of breath and wheezing.   Cardiovascular: Negative for chest pain and palpitations.  Gastrointestinal: Negative for abdominal pain, nausea and vomiting.    Social History   Tobacco Use  . Smoking status: Current Every Day Smoker  . Smokeless tobacco: Never Used  . Tobacco comment: chews tobacco  Substance Use Topics  . Alcohol use: Never    Frequency: Never   Objective:   BP 125/77 (BP Location: Left Arm, Patient Position: Sitting, Cuff Size: Large)   Pulse 81   Temp 98.3 F (36.8 C) (Oral)   Resp 18   Ht 6' (1.829 m)   Wt 221 lb (100.2 kg)  SpO2 98% Comment: room air  BMI 29.97 kg/m     Physical Exam  General appearance: alert, well developed, well nourished, cooperative and in no distress Head: Normocephalic, without obvious abnormality, atraumatic Respiratory: Respirations even and unlabored, normal respiratory rate Extremities: No gross deformities Skin: Skin color, texture, turgor normal. No rashes seen  Psych: Appropriate mood and affect. Neurologic: Mental status: Alert, oriented to person, place, and time, thought content appropriate.  Results for orders  placed or performed in visit on 10/05/17  POCT HgB A1C  Result Value Ref Range   Hemoglobin A1C 13.5 (A) 4.0 - 5.6 %   HbA1c POC (<> result, manual entry)     HbA1c, POC (prediabetic range)     HbA1c, POC (controlled diabetic range)     Est. average glucose Bld gHb Est-mCnc >326   POCT Glucose (CBG)  Result Value Ref Range   POC Glucose 191 (A) 70 - 99 mg/dl       Assessment & Plan:     1. Hyperglycemia Subjectively much better with recent initiation of metformin and much better glucose today.  Check labs.   - Insulin and C-Peptide - Amylase - Comprehensive metabolic panel - Lipid panel  Anticipated referral for diabetic education after reviewing labs.  Continue metformin for now.       Mila Merry, MD  [facility]

## 2017-10-06 ENCOUNTER — Other Ambulatory Visit: Payer: Self-pay | Admitting: Family Medicine

## 2017-10-06 DIAGNOSIS — R7401 Elevation of levels of liver transaminase levels: Secondary | ICD-10-CM

## 2017-10-06 DIAGNOSIS — R740 Nonspecific elevation of levels of transaminase and lactic acid dehydrogenase [LDH]: Principal | ICD-10-CM

## 2017-10-06 LAB — INSULIN AND C-PEPTIDE, SERUM
C-Peptide: 2 ng/mL (ref 1.1–4.4)
INSULIN: 8.1 u[IU]/mL (ref 2.6–24.9)

## 2017-10-06 LAB — COMPREHENSIVE METABOLIC PANEL
ALT: 108 IU/L — ABNORMAL HIGH (ref 0–44)
AST: 51 IU/L — ABNORMAL HIGH (ref 0–40)
Albumin/Globulin Ratio: 2.5 — ABNORMAL HIGH (ref 1.2–2.2)
Albumin: 5.2 g/dL (ref 3.5–5.5)
Alkaline Phosphatase: 97 IU/L (ref 39–117)
BUN/Creatinine Ratio: 16 (ref 9–20)
BUN: 14 mg/dL (ref 6–20)
Bilirubin Total: 0.8 mg/dL (ref 0.0–1.2)
CO2: 23 mmol/L (ref 20–29)
Calcium: 10.1 mg/dL (ref 8.7–10.2)
Chloride: 97 mmol/L (ref 96–106)
Creatinine, Ser: 0.87 mg/dL (ref 0.76–1.27)
GFR calc Af Amer: 134 mL/min/{1.73_m2} (ref >59)
GFR calc non Af Amer: 116 mL/min/{1.73_m2} (ref >59)
Globulin, Total: 2.1 g/dL (ref 1.5–4.5)
Glucose: 181 mg/dL — ABNORMAL HIGH (ref 65–99)
Potassium: 4.5 mmol/L (ref 3.5–5.2)
Sodium: 140 mmol/L (ref 134–144)
Total Protein: 7.3 g/dL (ref 6.0–8.5)

## 2017-10-06 LAB — LIPID PANEL
Chol/HDL Ratio: 5.9 ratio — ABNORMAL HIGH (ref 0.0–5.0)
Cholesterol, Total: 230 mg/dL — ABNORMAL HIGH (ref 100–199)
HDL: 39 mg/dL — ABNORMAL LOW (ref >39)
LDL Calculated: 129 mg/dL — ABNORMAL HIGH (ref 0–99)
Triglycerides: 310 mg/dL — ABNORMAL HIGH (ref 0–149)
VLDL Cholesterol Cal: 62 mg/dL — ABNORMAL HIGH (ref 5–40)

## 2017-10-06 LAB — AMYLASE: Amylase: 29 U/L — ABNORMAL LOW (ref 31–124)

## 2017-10-10 LAB — HEPATITIS B SURFACE ANTIBODY, QUANTITATIVE: Hepatitis B Surf Ab Quant: 5.9 m[IU]/mL — ABNORMAL LOW (ref >9.9)

## 2017-10-10 LAB — HEPATITIS C ANTIBODY: Hep C Virus Ab: 0.3 s/co ratio (ref 0.0–0.9)

## 2017-10-10 LAB — HEPATITIS A ANTIBODY, IGM: Hep A IgM: NEGATIVE

## 2017-10-10 LAB — HEPATITIS B CORE ANTIBODY, TOTAL: Hep B Core Total Ab: NEGATIVE

## 2017-10-10 LAB — HIV-1 RNA, QUALITATIVE, TMA: HIV-1 RNA Qualitative: NEGATIVE

## 2017-10-10 LAB — SPECIMEN STATUS REPORT

## 2017-10-11 ENCOUNTER — Other Ambulatory Visit: Payer: Self-pay | Admitting: Family Medicine

## 2017-10-11 MED ORDER — METFORMIN HCL 1000 MG PO TABS: 500.0000 mg | ORAL_TABLET | Freq: Two times a day (BID) | ORAL | 1 refills | Status: DC

## 2017-10-11 NOTE — Addendum Note (Signed)
Addended by: Marlene LardMILLER, Sheretta Grumbine M on: 10/11/2017 09:49 AM   Modules accepted: Orders

## 2017-10-11 NOTE — Telephone Encounter (Signed)
Pt called saying he is about out of the metformin 500 mg twice a day.  He was prescribed this it at the hospital  Pt's CB# [phone]  [facility] [address]  Thanks teri

## 2017-10-12 ENCOUNTER — Other Ambulatory Visit: Payer: Self-pay | Admitting: Family Medicine

## 2017-10-12 ENCOUNTER — Ambulatory Visit
Admission: RE | Admit: 2017-10-12 | Discharge: 2017-10-12 | Disposition: A | Discharge status: Home / Self Care | Payer: 59 | Source: Ambulatory Visit | Attending: Family Medicine | Admitting: Family Medicine

## 2017-10-12 DIAGNOSIS — K760 Fatty (change of) liver, not elsewhere classified: Secondary | ICD-10-CM | POA: Diagnosis not present

## 2017-10-12 DIAGNOSIS — R7401 Elevation of levels of liver transaminase levels: Secondary | ICD-10-CM

## 2017-10-12 DIAGNOSIS — E1122 Type 2 diabetes mellitus with diabetic chronic kidney disease: Secondary | ICD-10-CM

## 2017-10-12 DIAGNOSIS — R740 Nonspecific elevation of levels of transaminase and lactic acid dehydrogenase [LDH]: Secondary | ICD-10-CM | POA: Diagnosis present

## 2017-11-13 ENCOUNTER — Ambulatory Visit: Payer: 59 | Admitting: Family Medicine

## 2017-11-13 ENCOUNTER — Encounter: Payer: Self-pay | Admitting: Family Medicine

## 2017-11-13 VITALS — BP 120/90 | HR 93 | Temp 97.9°F | Resp 16 | Wt 225.0 lb

## 2017-11-13 DIAGNOSIS — E1122 Type 2 diabetes mellitus with diabetic chronic kidney disease: Secondary | ICD-10-CM

## 2017-11-13 LAB — POCT GLYCOSYLATED HEMOGLOBIN (HGB A1C)
Est. average glucose Bld gHb Est-mCnc: 243
Hemoglobin A1C: 10.1 % — AB (ref 4.0–5.6)

## 2017-11-13 MED ORDER — GLUCOSE BLOOD VI STRP: ORAL_STRIP | 12 refills | Status: DC

## 2017-11-13 MED ORDER — METFORMIN HCL 1000 MG PO TABS: 1000.0000 mg | ORAL_TABLET | Freq: Two times a day (BID) | ORAL | 2 refills | Status: DC

## 2017-11-13 NOTE — Progress Notes (Signed)
Patient: Francisco Andrews Male    DOB: 1985/07/30   32 y.o.   MRN: [id number] Visit Date: 11/13/2017  Today's Provider: Mila Merry, MD   Chief Complaint  Patient presents with  . 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 Patient reports healthy diet.  Last seen for diabetes 1 months ago.  Management since then includes continue Metformin. He reports excellent compliance with treatment. He is not having side effects.  Current symptoms include none and have been stable. Home blood sugar records: fasting range: not being checked  Episodes of hypoglycemia? no   Current Insulin Regimen: none Most Recent Eye Exam: no  Weight trend: stable Prior visit with dietician: no Current diet: in general, a "healthy" diet   Current exercise: walking  Pertinent Labs:    Component Value Date/Time   CHOL 230 (H) 10/05/2017 1423   TRIG 310 (H) 10/05/2017 1423   HDL 39 (L) 10/05/2017 1423   LDLCALC 129 (H) 10/05/2017 1423   CREATININE 0.87 10/05/2017 1423    Wt Readings from Last 3 Encounters:  11/13/17 225 lb (102.1 kg)  10/05/17 221 lb (100.2 kg)  09/24/17 230 lb (104.3 kg)    ------------------------------------------------------------------------      Allergies  Allergen Reactions  . Biaxin [Clarithromycin] Hives and Itching  . Biaxin [Clarithromycin] Hives  . Penicillins Hives and Itching  . Penicillins Hives    Has patient had a PCN reaction causing immediate rash, facial/tongue/throat swelling, SOB or lightheadedness with hypotension: /No/ Has patient had a PCN reaction causing severe rash involving mucus membranes or skin necrosis: /No/ Has patient had a PCN reaction that required hospitalization: No/ Has patient had a PCN reaction occurring within the last  10 years: No/ If all of the above answers are "NO", then may proceed with Cephalosporin use.   . Sulfa Antibiotics Hives, Itching and Swelling    Swelling in throat  . Sulfa Antibiotics Hives     Current Outpatient Medications:  .  aspirin-acetaminophen-caffeine (EXCEDRIN MIGRAINE) 250-250-65 MG tablet, Take 1 tablet by mouth at bedtime as needed for migraine., Disp: , Rfl:  .  fexofenadine (ALLEGRA) 180 MG tablet, Take 180 mg by mouth daily as needed for allergies. , Disp: , Rfl:  .  ibuprofen (ADVIL,MOTRIN) 200 MG tablet, Take 400-800 mg by mouth every 6 (six) hours as needed for moderate pain., Disp: , Rfl:  .  metFORMIN (GLUCOPHAGE) 1000 MG tablet, Take 0.5 tablets (500 mg total) by mouth 2 (two) times daily for 15 days., Disp: 60 tablet, Rfl: 1  Review of Systems  Constitutional: Negative.   Respiratory: Negative.   Cardiovascular: Negative.   Endocrine: Negative.     Social History   Tobacco Use  . Smoking status: Never Smoker  . Smokeless tobacco: Current User    Types: Snuff  . Tobacco comment: chews tobacco  Substance Use Topics  . Alcohol use: Not Currently    Frequency: Never   Objective:   BP 120/90 (BP Location: Right Arm, Patient Position: Sitting, Cuff Size: Large)   Pulse 93   Temp 97.9 F (36.6 C) (Oral)   Resp 16   Wt 225 lb (102.1 kg)   SpO2 98%   BMI 30.52 kg/m  Vitals:   11/13/17 1625  BP: 120/90  Pulse: 93  Resp: 16  Temp: 97.9 F (36.6 C)  TempSrc: Oral  SpO2: 98%  Weight: 225 lb (102.1 kg)    Physical Exam  General appearance: alert, well developed, well nourished, cooperative and in no distress Head: Normocephalic, without obvious abnormality, atraumatic Respiratory: Respirations even and unlabored, normal respiratory rate Extremities: No gross deformities Skin: Skin color, texture, turgor normal. No rashes seen  Psych: Appropriate mood and affect. Neurologic: Mental status: Alert, oriented to person, place, and time, thought  content appropriate.  Results for orders placed or performed in visit on 11/13/17  POCT glycosylated hemoglobin (Hb A1C)  Result Value Ref Range   Hemoglobin A1C 10.1 (A) 4.0 - 5.6 %   Est. average glucose Bld gHb Est-mCnc 243        Assessment & Plan:      1. Type 2 diabetes mellitus with chronic kidney disease, without long-term current use of insulin, unspecified CKD stage (HCC) Doing well since starting metformin in august, will double dose to 1000mg  bid and is to start checking BS daily. Patient instructed on use of glucometer and given sample of onetouch verio.  - POCT glycosylated hemoglobin (Hb A1C) - metFORMIN (GLUCOPHAGE) 1000 MG tablet; Take 1 tablet (1,000 mg total) by mouth 2 (two) times daily.  Dispense: 60 tablet; Refill: 2 - glucose blood (ONETOUCH VERIO) test strip; Use to check and record blood sugar once a day  Dispense: 100 each; Refill: 12  Return in about 3 months (around 02/12/2018).   He anticipates getting flu shot at work.        Mila Merryonald Fisher, MD  [facility]

## 2018-02-05 ENCOUNTER — Other Ambulatory Visit: Payer: Self-pay | Admitting: Family Medicine

## 2018-02-05 DIAGNOSIS — E1122 Type 2 diabetes mellitus with diabetic chronic kidney disease: Secondary | ICD-10-CM

## 2018-02-05 MED ORDER — METFORMIN HCL 1000 MG PO TABS: 1000.0000 mg | ORAL_TABLET | Freq: Two times a day (BID) | ORAL | 1 refills | Status: DC

## 2018-02-05 NOTE — Addendum Note (Signed)
Addended by: Malva LimesFISHER, Taliah Porche E on: 02/05/2018 07:52 AM   Modules accepted: Orders

## 2018-02-18 ENCOUNTER — Ambulatory Visit: Payer: Self-pay | Admitting: Family Medicine

## 2018-02-25 ENCOUNTER — Encounter: Payer: Self-pay | Admitting: Family Medicine

## 2018-02-25 ENCOUNTER — Ambulatory Visit: Payer: 59 | Admitting: Family Medicine

## 2018-02-25 VITALS — BP 129/83 | HR 98 | Temp 98.4°F | Resp 16 | Wt 214.0 lb

## 2018-02-25 DIAGNOSIS — Z6829 Body mass index (BMI) 29.0-29.9, adult: Secondary | ICD-10-CM | POA: Diagnosis not present

## 2018-02-25 DIAGNOSIS — E1122 Type 2 diabetes mellitus with diabetic chronic kidney disease: Secondary | ICD-10-CM

## 2018-02-25 LAB — POCT GLYCOSYLATED HEMOGLOBIN (HGB A1C)
Est. average glucose Bld gHb Est-mCnc: 137
Hemoglobin A1C: 6.4 % — AB (ref 4.0–5.6)

## 2018-02-25 NOTE — Patient Instructions (Signed)
.   Please bring all of your medications to every appointment so we can make sure that our medication list is the same as yours.   

## 2018-02-25 NOTE — Progress Notes (Signed)
Patient: Francisco Andrews Male    DOB: Oct 17, 1985   32 y.o.   MRN: [id number] Visit Date: 02/25/2018  Today's Provider: Mila Merryonald Guss Farruggia, MD   Chief Complaint  Patient presents with  . Diabetes   Subjective:     HPI  Diabetes Mellitus Type II, Follow-up:   Lab Results  Component Value Date   HGBA1C 10.1 (A) 11/13/2017   HGBA1C 13.5 (A) 10/05/2017    Last seen for diabetes 3 months ago.  Management since then includes doubling the dose of Metformin to 1,000mg  twice daily. He reports good compliance with treatment. He is not having side effects.  Current symptoms include none and have been stable. Home blood sugar records: blood sugars are not checked at home  Episodes of hypoglycemia? no   Current Insulin Regimen: none Most Recent Eye Exam: >1 year ago Weight trend: decreasing steadily Prior visit with dietician: no Current diet: well balanced Current exercise: cardiovascular workout on exercise equipment  Pertinent Labs:    Component Value Date/Time   CHOL 230 (H) 10/05/2017 1423   TRIG 310 (H) 10/05/2017 1423   HDL 39 (L) 10/05/2017 1423   LDLCALC 129 (H) 10/05/2017 1423   CREATININE 0.87 10/05/2017 1423    Wt Readings from Last 3 Encounters:  02/25/18 214 lb (97.1 kg)  11/13/17 225 lb (102.1 kg)  10/05/17 221 lb (100.2 kg)    ------------------------------------------------------------------------  Allergies  Allergen Reactions  . Biaxin [Clarithromycin] Hives and Itching  . Biaxin [Clarithromycin] Hives  . Penicillins Hives and Itching  . Penicillins Hives    Has patient had a PCN reaction causing immediate rash, facial/tongue/throat swelling, SOB or lightheadedness with hypotension: /No/ Has patient had a PCN reaction causing severe rash involving mucus membranes or skin necrosis: /No/ Has patient had a PCN reaction that required hospitalization: No/ Has patient had a PCN reaction occurring within the last 10 years: No/ If all of the  above answers are "NO", then may proceed with Cephalosporin use.   . Sulfa Antibiotics Hives, Itching and Swelling    Swelling in throat  . Sulfa Antibiotics Hives     Current Outpatient Medications:  .  aspirin-acetaminophen-caffeine (EXCEDRIN MIGRAINE) 250-250-65 MG tablet, Take 1 tablet by mouth at bedtime as needed for migraine., Disp: , Rfl:  .  fexofenadine (ALLEGRA) 180 MG tablet, Take 180 mg by mouth daily as needed for allergies. , Disp: , Rfl:  .  glucose blood (ONETOUCH VERIO) test strip, Use to check and record blood sugar once a day, Disp: 100 each, Rfl: 12 .  ibuprofen (ADVIL,MOTRIN) 200 MG tablet, Take 400-800 mg by mouth every 6 (six) hours as needed for moderate pain., Disp: , Rfl:  .  metFORMIN (GLUCOPHAGE) 1000 MG tablet, Take 1 tablet (1,000 mg total) by mouth 2 (two) times daily., Disp: 60 tablet, Rfl: 1  Review of Systems  Constitutional: Negative for appetite change, chills and fever.  Respiratory: Negative for chest tightness, shortness of breath and wheezing.   Cardiovascular: Negative for chest pain and palpitations.  Gastrointestinal: Negative for abdominal pain, nausea and vomiting.    Social History   Tobacco Use  . Smoking status: Never Smoker  . Smokeless tobacco: Current User    Types: Snuff  . Tobacco comment: chews tobacco  Substance Use Topics  . Alcohol use: Not Currently    Frequency: Never      Objective:   BP 129/83 (BP Location: Right Arm, Patient Position: Sitting, Cuff Size: Large)  Pulse 98   Temp 98.4 F (36.9 C) (Oral)   Resp 16   Wt 214 lb (97.1 kg)   SpO2 99% Comment: room air  BMI 29.02 kg/m  Vitals:   02/25/18 1616  BP: 129/83  Pulse: 98  Resp: 16  Temp: 98.4 F (36.9 C)  TempSrc: Oral  SpO2: 99%  Weight: 214 lb (97.1 kg)     Physical Exam   General Appearance:    Alert, cooperative, no distress  Eyes:    PERRL, conjunctiva/corneas clear, EOM's intact       Lungs:     Clear to auscultation bilaterally,  respirations unlabored  Heart:    Regular rate and rhythm  Neurologic:   Awake, alert, oriented x 3. No apparent focal neurological           defect.       Results for orders placed or performed in visit on 02/25/18  POCT HgB A1C  Result Value Ref Range   Hemoglobin A1C 6.4 (A) 4.0 - 5.6 %   HbA1c POC (<> result, manual entry)     HbA1c, POC (prediabetic range)     HbA1c, POC (controlled diabetic range)     Est. average glucose Bld gHb Est-mCnc 137        Assessment & Plan    1. Type 2 diabetes mellitus with chronic kidney disease, without long-term current use of insulin, unspecified CKD stage (HCC) Much better with improved diet and lifestyle, exercising regularly, and taking metformin consistently. Counseled to monitor home blood sugars at least weekly and Continue current medications.   - POCT HgB A1C  2. BMI 29.0-29.9,adult Counseled regarding prudent diet and regular exercise.   Follow up 4 months  Recommended flu vaccine which he refused today.     Mila Merryonald Allysen Lazo, MD  [facility]

## 2018-04-08 ENCOUNTER — Other Ambulatory Visit: Payer: Self-pay | Admitting: Family Medicine

## 2018-04-08 DIAGNOSIS — E1122 Type 2 diabetes mellitus with diabetic chronic kidney disease: Secondary | ICD-10-CM

## 2018-06-28 ENCOUNTER — Ambulatory Visit: Payer: Self-pay | Admitting: Family Medicine

## 2018-07-04 ENCOUNTER — Other Ambulatory Visit: Payer: Self-pay | Admitting: Family Medicine

## 2018-07-04 DIAGNOSIS — E1122 Type 2 diabetes mellitus with diabetic chronic kidney disease: Secondary | ICD-10-CM

## 2018-08-23 ENCOUNTER — Encounter: Payer: Self-pay | Admitting: Family Medicine

## 2018-08-23 ENCOUNTER — Other Ambulatory Visit: Payer: Self-pay

## 2018-08-23 ENCOUNTER — Ambulatory Visit (INDEPENDENT_AMBULATORY_CARE_PROVIDER_SITE_OTHER): Payer: 59 | Admitting: Family Medicine

## 2018-08-23 VITALS — BP 126/82 | HR 88 | Temp 98.3°F | Resp 16 | Wt 219.0 lb

## 2018-08-23 DIAGNOSIS — E119 Type 2 diabetes mellitus without complications: Secondary | ICD-10-CM

## 2018-08-23 LAB — POCT GLYCOSYLATED HEMOGLOBIN (HGB A1C): Hemoglobin A1C: 6.4 % — AB (ref 4.0–5.6)

## 2018-08-23 LAB — POCT UA - MICROALBUMIN: Microalbumin Ur, POC: 20 mg/L

## 2018-08-23 NOTE — Patient Instructions (Signed)
.   Please review the attached list of medications and notify my office if there are any errors.   . Please bring all of your medications to every appointment so we can make sure that our medication list is the same as yours.   

## 2018-08-23 NOTE — Progress Notes (Signed)
Patient: Francisco Andrews Male    DOB: 06-15-1985   33 y.o.   MRN: [id number] Visit Date: 08/23/2018  Today's Provider: Lelon Huh, MD   Chief Complaint  Patient presents with  . Diabetes   Subjective:     HPI    Diabetes Mellitus Type II, Follow-up:   Lab Results  Component Value Date   HGBA1C 6.4 (A) 08/23/2018   HGBA1C 6.4 (A) 02/25/2018   HGBA1C 10.1 (A) 11/13/2017   Last seen for diabetes 6 months ago.  Management since then includes No changes. He reports excellent compliance with treatment. He is not having side effects.  Current symptoms include none and have been stable. Home blood sugar records:Pt is not checking his blood sugar.  Episodes of hypoglycemia? no    Most Recent Eye Exam: Pt is due for an eye exam Weight trend: stable  Current diet: in general, a "healthy" diet     ------------------------------------------------------------------------    Wt Readings from Last 3 Encounters:  08/23/18 219 lb (99.3 kg)  02/25/18 214 lb (97.1 kg)  11/13/17 225 lb (102.1 kg)    ------------------------------------------------------------------------    Allergies  Allergen Reactions  . Biaxin [Clarithromycin] Hives and Itching  . Biaxin [Clarithromycin] Hives  . Penicillins Hives and Itching  . Penicillins Hives    Has patient had a PCN reaction causing immediate rash, facial/tongue/throat swelling, SOB or lightheadedness with hypotension: /No/ Has patient had a PCN reaction causing severe rash involving mucus membranes or skin necrosis: /No/ Has patient had a PCN reaction that required hospitalization: No/ Has patient had a PCN reaction occurring within the last 10 years: No/ If all of the above answers are "NO", then may proceed with Cephalosporin use.   . Sulfa Antibiotics Hives, Itching and Swelling    Swelling in throat  . Sulfa Antibiotics Hives     Current Outpatient Medications:  .  aspirin-acetaminophen-caffeine (EXCEDRIN  MIGRAINE) 250-250-65 MG tablet, Take 1 tablet by mouth at bedtime as needed for migraine., Disp: , Rfl:  .  fexofenadine (ALLEGRA) 180 MG tablet, Take 180 mg by mouth daily as needed for allergies. , Disp: , Rfl:  .  glucose blood (ONETOUCH VERIO) test strip, Use to check and record blood sugar once a day, Disp: 100 each, Rfl: 12 .  ibuprofen (ADVIL,MOTRIN) 200 MG tablet, Take 400-800 mg by mouth every 6 (six) hours as needed for moderate pain., Disp: , Rfl:  .  metFORMIN (GLUCOPHAGE) 1000 MG tablet, TAKE 1 TABLET BY MOUTH TWICE A DAY, Disp: 60 tablet, Rfl: 2  Review of Systems  Constitutional: Negative.   Respiratory: Negative.   Cardiovascular: Negative.   Gastrointestinal: Negative.   Musculoskeletal: Negative.   Neurological: Negative for dizziness, light-headedness and headaches.    Social History   Tobacco Use  . Smoking status: Never Smoker  . Smokeless tobacco: Current User    Types: Snuff  . Tobacco comment: chews tobacco  Substance Use Topics  . Alcohol use: Not Currently    Frequency: Never      Objective:   BP 126/82 (BP Location: Right Arm, Patient Position: Sitting, Cuff Size: Large)   Pulse 88   Temp 98.3 F (36.8 C) (Oral)   Resp 16   Wt 219 lb (99.3 kg)   BMI 29.70 kg/m  Vitals:   08/23/18 1624  BP: 126/82  Pulse: 88  Resp: 16  Temp: 98.3 F (36.8 C)  TempSrc: Oral  Weight: 219 lb (99.3 kg)  Physical Exam   General appearance: alert, well developed, well nourished, cooperative and in no distress Head: Normocephalic, without obvious abnormality, atraumatic Respiratory: Respirations even and unlabored, normal respiratory rate Extremities: No gross deformities Skin: Skin color, texture, turgor normal. No rashes seen  Psych: Appropriate mood and affect. Neurologic: Mental status: Alert, oriented to person, place, and time, thought content appropriate.  Results for orders placed or performed in visit on 08/23/18  POCT glycosylated hemoglobin  (Hb A1C)  Result Value Ref Range   Hemoglobin A1C 6.4 (A) 4.0 - 5.6 %  POCT UA - Microalbumin  Result Value Ref Range   Microalbumin Ur, POC 20 mg/L       Assessment & Plan    1. Type 2 diabetes mellitus without complication, without long-term current use of insulin (HCC) Well controlled.  Continue current medications.  Follow up 6 months.    The entirety of the information documented in the History of Present Illness, Review of Systems and Physical Exam were personally obtained by me. Portions of this information were initially documented by Kavin LeechLaura Walsh, CMA and reviewed by me for thoroughness and accuracy.      Mila Merryonald Fisher, MD  [facility]

## 2018-09-23 ENCOUNTER — Other Ambulatory Visit: Payer: Self-pay | Admitting: Family Medicine

## 2018-09-23 DIAGNOSIS — E1122 Type 2 diabetes mellitus with diabetic chronic kidney disease: Secondary | ICD-10-CM

## 2018-12-28 ENCOUNTER — Other Ambulatory Visit: Payer: Self-pay | Admitting: Family Medicine

## 2018-12-28 DIAGNOSIS — E1122 Type 2 diabetes mellitus with diabetic chronic kidney disease: Secondary | ICD-10-CM

## 2019-01-31 ENCOUNTER — Ambulatory Visit: Payer: 59 | Admitting: Family Medicine

## 2019-02-14 ENCOUNTER — Encounter: Payer: Self-pay | Admitting: Family Medicine

## 2019-02-14 ENCOUNTER — Other Ambulatory Visit: Payer: Self-pay

## 2019-02-14 ENCOUNTER — Ambulatory Visit: Payer: 59 | Admitting: Family Medicine

## 2019-02-14 VITALS — BP 152/97 | HR 94 | Temp 97.3°F | Resp 16 | Ht 73.0 in | Wt 233.0 lb

## 2019-02-14 DIAGNOSIS — R6882 Decreased libido: Secondary | ICD-10-CM | POA: Diagnosis not present

## 2019-02-14 DIAGNOSIS — E1122 Type 2 diabetes mellitus with diabetic chronic kidney disease: Secondary | ICD-10-CM

## 2019-02-14 DIAGNOSIS — K760 Fatty (change of) liver, not elsewhere classified: Secondary | ICD-10-CM | POA: Diagnosis not present

## 2019-02-14 DIAGNOSIS — R7401 Elevation of levels of liver transaminase levels: Secondary | ICD-10-CM | POA: Diagnosis not present

## 2019-02-14 LAB — POCT GLYCOSYLATED HEMOGLOBIN (HGB A1C)
Est. average glucose Bld gHb Est-mCnc: 180
Hemoglobin A1C: 7.9 % — AB (ref 4.0–5.6)

## 2019-02-14 NOTE — Progress Notes (Signed)
Patient: Francisco Andrews Male    DOB: [birth year]   33 y.o.   MRN: [id number] Visit Date: 02/14/2019  Today's Provider: Lelon Huh, MD   Chief Complaint  Patient presents with  . Diabetes   Subjective:     HPI  Diabetes Mellitus Type II, Follow-up:   Lab Results  Component Value Date   HGBA1C 7.9 (A) 02/14/2019   HGBA1C 6.4 (A) 08/23/2018   HGBA1C 6.4 (A) 02/25/2018    Last seen for diabetes 6 months ago.  Management since then includes no changes. He reports excellent compliance with treatment. He is not having side effects.  Current symptoms include none and have been stable. Home blood sugar records: not being checked  Episodes of hypoglycemia? no   Current insulin regiment: Is not on insulin Most Recent Eye Exam:  Weight trend: stable Prior visit with dietician: No Current exercise: walking Current diet habits: not asked  Pertinent Labs:    Component Value Date/Time   CHOL 230 (H) 10/05/2017 1423   TRIG 310 (H) 10/05/2017 1423   HDL 39 (L) 10/05/2017 1423   LDLCALC 129 (H) 10/05/2017 1423   CREATININE 0.87 10/05/2017 1423    Wt Readings from Last 3 Encounters:  02/14/19 233 lb (105.7 kg)  08/23/18 219 lb (99.3 kg)  02/25/18 214 lb (97.1 kg)    He also reports he has had some issues with ED and poor libido for several months.  He has been out of his regular routine since Covid and not exercising like he used to.   He was noted to elevated transaminases last year and had u/s showing marked hepatic steatosis.  ------------------------------------------------------------------------  Allergies  Allergen Reactions  . Biaxin [Clarithromycin] Hives and Itching  . Biaxin [Clarithromycin] Hives  . Penicillins Hives and Itching  . Penicillins Hives    Has patient had a PCN reaction causing immediate rash, facial/tongue/throat swelling, SOB or lightheadedness with hypotension: /No/ Has patient had a PCN reaction causing severe rash involving  mucus membranes or skin necrosis: /No/ Has patient had a PCN reaction that required hospitalization: No/ Has patient had a PCN reaction occurring within the last 10 years: No/ If all of the above answers are "NO", then may proceed with Cephalosporin use.   . Sulfa Antibiotics Hives, Itching and Swelling    Swelling in throat  . Sulfa Antibiotics Hives     Current Outpatient Medications:  .  fexofenadine (ALLEGRA) 180 MG tablet, Take 180 mg by mouth daily as needed for allergies. , Disp: , Rfl:  .  metFORMIN (GLUCOPHAGE) 1000 MG tablet, TAKE 1 TABLET BY MOUTH TWICE A DAY, Disp: 60 tablet, Rfl: 11  Review of Systems  Constitutional: Negative.   Eyes: Negative.   Respiratory: Negative.   Cardiovascular: Negative.   Endocrine: Negative.     Social History   Tobacco Use  . Smoking status: Never Smoker  . Smokeless tobacco: Current User    Types: Snuff  . Tobacco comment: chews tobacco  Substance Use Topics  . Alcohol use: Not Currently      Objective:   BP (!) 152/97 (BP Location: Right Arm, Patient Position: Sitting, Cuff Size: Large)   Pulse 94   Temp (!) 97.3 F (36.3 C) (Temporal)   Resp 16   Ht 6\' 1"  (1.854 m)   Wt 233 lb (105.7 kg)   BMI 30.74 kg/m  Vitals:   02/14/19 1516  BP: (!) 152/97  Pulse: 94  Resp: 16  Temp: (!) 97.3 F (36.3 C)  TempSrc: Temporal  Weight: 233 lb (105.7 kg)  Height: 6\' 1"  (1.854 m)  Body mass index is 30.74 kg/m.   Physical Exam  General appearance: Overweight male, cooperative and in no acute distress Head: Normocephalic, without obvious abnormality, atraumatic Respiratory: Respirations even and unlabored, normal respiratory rate Extremities: All extremities are intact.  Skin: Skin color, texture, turgor normal. No rashes seen  Psych: Appropriate mood and affect. Neurologic: Mental status: Alert, oriented to person, place, and time, thought content appropriate.  Results for orders placed or performed in visit on 02/14/19    POCT HgB A1C  Result Value Ref Range   Hemoglobin A1C 7.9 (A) 4.0 - 5.6 %   Est. average glucose Bld gHb Est-mCnc 180        Assessment & Plan     1. Type 2 diabetes mellitus with chronic kidney disease, without long-term current use of insulin, unspecified CKD stage (HCC) Worsening since not exercising with Covid, significant weight gain. He anticipates getting back on his regular workup regiment and losing weight.   Discussed possibly adding pioglitazone if not improved at follow up in about 3 months.  - Comprehensive metabolic panel - CBC  2. Diminished libido  - Testosterone,Free and Total ([facility])  3. Hepatic steatosis Likely secondary to diabetes. He states he did get hepatitis b vaccine in grade school.  - Comprehensive metabolic panel - CBC - Hepatitis A Ab, Total  4. Elevated transaminase level  - Hepatitis A Ab, Total  The entirety of the information documented in the History of Present Illness, Review of Systems and Physical Exam were personally obtained by me. Portions of this information were initially documented by 02/16/19, CMA and reviewed by me for thoroughness and accuracy.      Rondel Baton, MD  [facility]

## 2019-02-14 NOTE — Patient Instructions (Signed)
.   Please review the attached list of medications and notify my office if there are any errors.   . Please bring all of your medications to every appointment so we can make sure that our medication list is the same as yours.   . It is especially important to get the annual flu vaccine this year. If you haven't had it already, please go to your pharmacy or call the office as soon as possible to schedule you flu shot.  . Please contact your eyecare professional to schedule a routine eye exam. I recommend seeing Dr. Jomarie Longs at [phone] or the [facility] at [phone]

## 2019-02-16 ENCOUNTER — Other Ambulatory Visit: Payer: Self-pay | Admitting: Family Medicine

## 2019-02-16 DIAGNOSIS — R7401 Elevation of levels of liver transaminase levels: Secondary | ICD-10-CM

## 2019-02-16 DIAGNOSIS — K760 Fatty (change of) liver, not elsewhere classified: Secondary | ICD-10-CM

## 2019-02-16 LAB — COMPREHENSIVE METABOLIC PANEL
ALT: 201 IU/L — ABNORMAL HIGH (ref 0–44)
AST: 90 IU/L — ABNORMAL HIGH (ref 0–40)
Albumin/Globulin Ratio: 1.9 (ref 1.2–2.2)
Albumin: 5.2 g/dL — ABNORMAL HIGH (ref 4.0–5.0)
Alkaline Phosphatase: 84 IU/L (ref 39–117)
BUN/Creatinine Ratio: 15 (ref 9–20)
BUN: 12 mg/dL (ref 6–20)
Bilirubin Total: 0.6 mg/dL (ref 0.0–1.2)
CO2: 26 mmol/L (ref 20–29)
Calcium: 10.2 mg/dL (ref 8.7–10.2)
Chloride: 95 mmol/L — ABNORMAL LOW (ref 96–106)
Creatinine, Ser: 0.82 mg/dL (ref 0.76–1.27)
GFR calc Af Amer: 135 mL/min/{1.73_m2} (ref >59)
GFR calc non Af Amer: 117 mL/min/{1.73_m2} (ref >59)
Globulin, Total: 2.7 g/dL (ref 1.5–4.5)
Glucose: 158 mg/dL — ABNORMAL HIGH (ref 65–99)
Potassium: 4.3 mmol/L (ref 3.5–5.2)
Sodium: 138 mmol/L (ref 134–144)
Total Protein: 7.9 g/dL (ref 6.0–8.5)

## 2019-02-16 LAB — CBC
Hematocrit: 48.3 % (ref 37.5–51.0)
Hemoglobin: 16.1 g/dL (ref 13.0–17.7)
MCH: 29.2 pg (ref 26.6–33.0)
MCHC: 33.3 g/dL (ref 31.5–35.7)
MCV: 88 fL (ref 79–97)
Platelets: 338 10*3/uL (ref 150–450)
RBC: 5.51 x10E6/uL (ref 4.14–5.80)
RDW: 12.4 % (ref 11.6–15.4)
WBC: 8.8 10*3/uL (ref 3.4–10.8)

## 2019-02-16 LAB — TESTOSTERONE,FREE AND TOTAL
Testosterone, Free: 13.6 pg/mL (ref 8.7–25.1)
Testosterone: 205 ng/dL — ABNORMAL LOW (ref 264–916)

## 2019-02-16 LAB — HEPATITIS A ANTIBODY, TOTAL: hep A Total Ab: NEGATIVE

## 2019-02-25 ENCOUNTER — Ambulatory Visit (INDEPENDENT_AMBULATORY_CARE_PROVIDER_SITE_OTHER): Payer: 59 | Admitting: Adult Health

## 2019-02-25 ENCOUNTER — Other Ambulatory Visit: Payer: Self-pay

## 2019-02-25 ENCOUNTER — Encounter: Payer: Self-pay | Admitting: Adult Health

## 2019-02-25 VITALS — Temp 97.1°F

## 2019-02-25 DIAGNOSIS — Z23 Encounter for immunization: Secondary | ICD-10-CM | POA: Diagnosis not present

## 2019-02-25 NOTE — Progress Notes (Signed)
Patient comes into office today to update immunization(s), patient reports that they feel well today and have no complaints or concerns to address. Immunization record has been reviewed with patient and information handout in regards to vaccine has been given. Patient was observed after injection and tolerated well with no adverse reaction or side effects.   Vitals:   02/25/19 1612  Temp: (!) 97.1 F (36.2 C)   Immunization History  Administered Date(s) Administered  . Hepatitis A, Adult 02/25/2019   Nunzio Cory. W, NCMA

## 2019-02-26 ENCOUNTER — Encounter: Payer: Self-pay | Admitting: Family Medicine

## 2019-02-26 DIAGNOSIS — F5221 Male erectile disorder: Secondary | ICD-10-CM

## 2019-02-26 DIAGNOSIS — R6882 Decreased libido: Secondary | ICD-10-CM

## 2019-03-03 MED ORDER — TADALAFIL 5 MG PO TABS: 5.0000 mg | ORAL_TABLET | Freq: Every day | ORAL | 5 refills | Status: DC

## 2019-03-03 NOTE — Telephone Encounter (Deleted)
I put in an order to recheck testosterone levels. You can stop by the lab at your convenience between 8am and 4pm M-F.   I sent a prescription for [facility]is to [facility]. It works best by taking a low dose once every day. However, it is generic now which means the cost of the medication can be dramatically different between pharmacies. If your insurance doesn't cover it you may want to check prices at [facility], [facility] or [facility] pharmacy which are usually better than [facility].

## 2019-04-04 ENCOUNTER — Other Ambulatory Visit: Payer: Self-pay

## 2019-04-10 ENCOUNTER — Ambulatory Visit: Payer: 59 | Admitting: Gastroenterology

## 2019-05-26 ENCOUNTER — Ambulatory Visit: Payer: 59 | Admitting: Gastroenterology

## 2019-05-26 ENCOUNTER — Encounter: Payer: Self-pay | Admitting: Gastroenterology

## 2019-06-10 ENCOUNTER — Other Ambulatory Visit: Payer: Self-pay

## 2019-06-10 ENCOUNTER — Ambulatory Visit: Payer: 59 | Admitting: Gastroenterology

## 2019-06-10 VITALS — BP 131/84 | HR 82 | Temp 98.2°F | Ht 73.0 in | Wt 226.6 lb

## 2019-06-10 DIAGNOSIS — R945 Abnormal results of liver function studies: Secondary | ICD-10-CM

## 2019-06-10 DIAGNOSIS — R7989 Other specified abnormal findings of blood chemistry: Secondary | ICD-10-CM

## 2019-06-10 DIAGNOSIS — R319 Hematuria, unspecified: Secondary | ICD-10-CM

## 2019-06-10 NOTE — Addendum Note (Signed)
Addended by: Daisy Blossom on: 06/10/2019 01:37 PM   Modules accepted: Orders

## 2019-06-10 NOTE — Patient Instructions (Signed)
Fatty Liver Disease  Fatty liver disease occurs when too much fat has built up in your liver cells. Fatty liver disease is also called hepatic steatosis or steatohepatitis. The liver removes harmful substances from your bloodstream and produces fluids that your body needs. It also helps your body use and store energy from the food you eat. In many cases, fatty liver disease does not cause symptoms or problems. It is often diagnosed when tests are being done for other reasons. However, over time, fatty liver can cause inflammation that may lead to more serious liver problems, such as scarring of the liver (cirrhosis) and liver failure. Fatty liver is associated with insulin resistance, increased body fat, high blood pressure (hypertension), and high cholesterol. These are features of metabolic syndrome and increase your risk for stroke, diabetes, and heart disease. What are the causes? This condition may be caused by:  Drinking too much alcohol.  Poor nutrition.  Obesity.  Cushing's syndrome.  Diabetes.  High cholesterol.  Certain drugs.  Poisons.  Some viral infections.  Pregnancy. What increases the risk? You are more likely to develop this condition if you:  Abuse alcohol.  Are overweight.  Have diabetes.  Have hepatitis.  Have a high triglyceride level.  Are pregnant. What are the signs or symptoms? Fatty liver disease often does not cause symptoms. If symptoms do develop, they can include:  Fatigue.  Weakness.  Weight loss.  Confusion.  Abdominal pain.  Nausea and vomiting.  Yellowing of your skin and the white parts of your eyes (jaundice).  Itchy skin. How is this diagnosed? This condition may be diagnosed by:  A physical exam and medical history.  Blood tests.  Imaging tests, such as an ultrasound, CT scan, or MRI.  A liver biopsy. A small sample of liver tissue is removed using a needle. The sample is then looked at under a microscope. How  is this treated? Fatty liver disease is often caused by other health conditions. Treatment for fatty liver may involve medicines and lifestyle changes to manage conditions such as:  Alcoholism.  High cholesterol.  Diabetes.  Being overweight or obese. Follow these instructions at home:   Do not drink alcohol. If you have trouble quitting, ask your health care provider how to safely quit with the help of medicine or a supervised program. This is important to keep your condition from getting worse.  Eat a healthy diet as told by your health care provider. Ask your health care provider about working with a diet and nutrition specialist (dietitian) to develop an eating plan.  Exercise regularly. This can help you lose weight and control your cholesterol and diabetes. Talk to your health care provider about an exercise plan and which activities are best for you.  Take over-the-counter and prescription medicines only as told by your health care provider.  Keep all follow-up visits as told by your health care provider. This is important. Contact a health care provider if: You have trouble controlling your:  Blood sugar. This is especially important if you have diabetes.  Cholesterol.  Drinking of alcohol. Get help right away if:  You have abdominal pain.  You have jaundice.  You have nausea and vomiting.  You vomit blood or material that looks like coffee grounds.  You have stools that are black, tar-like, or bloody. Summary  Fatty liver disease develops when too much fat builds up in the cells of your liver.  Fatty liver disease often causes no symptoms or problems. However, over   time, fatty liver can cause inflammation that may lead to more serious liver problems, such as scarring of the liver (cirrhosis).  You are more likely to develop this condition if you abuse alcohol, are pregnant, are overweight, have diabetes, have hepatitis, or have high triglyceride  levels.  Contact your health care provider if you have trouble controlling your weight, blood sugar, cholesterol, or drinking of alcohol. This information is not intended to replace advice given to you by your health care provider. Make sure you discuss any questions you have with your health care provider. Document Revised: 01/26/2017 Document Reviewed: 11/22/2016 Elsevier Patient Education  2020 [employer]  

## 2019-06-10 NOTE — Progress Notes (Signed)
Francisco Bellows MD, MRCP(U.K) [address]  [place]  [place], [place] [address]  Main: [phone]  Fax: [phone]   Gastroenterology Consultation  Referring Provider:     Birdie Sons, MD Primary Care Physician:  Birdie Sons, MD Primary Gastroenterologist:  Dr. Jonathon Andrews  Reason for Consultation:     Abnormal LFT's         HPI:   Francisco Andrews is a 34 y.o. y/o male referred for consultation & management  by Dr. Caryn Section, Kirstie Peri, MD.   He has been referred for abnormal LFT's. Hb 16.1 grams . RUQ USG showed hepatic steatosis.   First noted abnormality in LFT's in 2019  Alcohol use none since 10 years.  Prior to that was not a heavy drinker.  Drug use no Over the counter herbal supplements no New medications no Abdominal pain no Tattoos no Military service no Prior blood transfusion no Incarceration no History of travel no Family history of liver disease mother had fatty liver   He also mentioned that recently he has noted blood in the urine bright red.  He actually has pictures on his phone which she showed me which looked like his urine was pretty dark and reddish.  He works out a lot.   Hepatic Function Latest Ref Rng & Units 02/14/2019 10/05/2017  Total Protein 6.0 - 8.5 g/dL 7.9 7.3  Albumin 4.0 - 5.0 g/dL 5.2(H) 5.2  AST 0 - 40 IU/L 90(H) 51(H)  ALT 0 - 44 IU/L 201(H) 108(H)  Alk Phosphatase 39 - 117 IU/L 84 97  Total Bilirubin 0.0 - 1.2 mg/dL 0.6 0.8     Past Medical History:  Diagnosis Date  . Allergy     No past surgical history on file.  Prior to Admission medications   Medication Sig Start Date End Date Taking? Authorizing Provider  fexofenadine (ALLEGRA) 180 MG tablet Take 180 mg by mouth daily as needed for allergies.     [provider]  metFORMIN (GLUCOPHAGE) 1000 MG tablet TAKE 1 TABLET BY MOUTH TWICE A DAY 12/28/18   Birdie Sons, MD  tadalafil (CIALIS) 5 MG tablet Take 1 tablet (5 mg total) by mouth daily.  03/03/19   Birdie Sons, MD    Family History  Problem Relation Age of Onset  . Hypertension Mother   . Arthritis Mother   . Arthritis Father   . Hypertension Maternal Aunt   . Diabetes Maternal Uncle   . Hypertension Maternal Uncle   . Cancer Maternal Grandmother   . Diabetes Maternal Grandmother   . Diabetes Maternal Grandfather   . Hypertension Maternal Grandfather   . Heart disease Maternal Grandfather   . Heart disease Paternal Grandfather      Social History   Tobacco Use  . Smoking status: Never Smoker  . Smokeless tobacco: Current User    Types: Snuff  . Tobacco comment: chews tobacco  Substance Use Topics  . Alcohol use: Not Currently  . Drug use: Never    Allergies as of 06/10/2019 - Review Complete 02/14/2019  Allergen Reaction Noted  . Biaxin [clarithromycin] Hives and Itching 03/26/2012  . Penicillins Hives 05/20/2017  . Sulfa antibiotics Hives, Itching, and Swelling 03/26/2012    Review of Systems:    All systems reviewed and negative except where noted in HPI.   Physical Exam:  There were no vitals taken for this visit. No LMP for male patient. Psych:  Alert and cooperative. Normal mood and  affect. General:   Alert,  Well-developed, well-nourished, pleasant and cooperative in NAD Head:  Normocephalic and atraumatic. Eyes:  Sclera clear, no icterus.   Conjunctiva pink. Ears:  Normal auditory acuity. Lungs:  Respirations even and unlabored.  Clear throughout to auscultation.   No wheezes, crackles, or rhonchi. No acute distress. Heart:  Regular rate and rhythm; no murmurs, clicks, rubs, or gallops. Abdomen:  Normal bowel sounds.  No bruits.  Soft, non-tender and non-distended without masses, hepatosplenomegaly or hernias noted.  No guarding or rebound tenderness.    Neurologic:  Alert and oriented x3;  grossly normal neurologically. Psych:  Alert and cooperative. Normal mood and affect.    Imaging Studies: No results found.  Assessment and  Plan:   Francisco Andrews is a 34 y.o. y/o male has been referred for abnormal LFT's most likely secondary to fatty liver disease.   Further labs necessary to look for viral hepatitis, autoimmune liver disease, Primary Biliary cirrhosis, celiac disease, muscle disorders,Primary Sclerosing Cholangitis, Hemachromatosis, Wilson's disease, or A-1 antitrypsin deficiency.  1. NAFLD: exercise, weight loss, mediterranean diet. Addressing cardiovascular risk factors. Patient information provided   2.  Blood in the urine: Differential includes myoglobinuria as he works out a lot.  I will check his urine analysis and referred to urology  Follow up in 6 weeks.   Dr Francisco Bellows MD,MRCP(U.K)

## 2019-06-13 LAB — URINALYSIS
Bilirubin, UA: NEGATIVE
Glucose, UA: NEGATIVE
Ketones, UA: NEGATIVE
Leukocytes,UA: NEGATIVE
Nitrite, UA: NEGATIVE
Specific Gravity, UA: 1.021 (ref 1.005–1.030)
Urobilinogen, Ur: 0.2 mg/dL (ref 0.2–1.0)
pH, UA: 6 (ref 5.0–7.5)

## 2019-06-13 LAB — CERULOPLASMIN: Ceruloplasmin: 24.1 mg/dL (ref 16.0–31.0)

## 2019-06-13 LAB — HIV ANTIBODY (ROUTINE TESTING W REFLEX): HIV Screen 4th Generation wRfx: NONREACTIVE

## 2019-06-13 LAB — HEPATITIS C ANTIBODY: Hep C Virus Ab: <0.1 s/co ratio (ref 0.0–0.9)

## 2019-06-13 LAB — IRON,TIBC AND FERRITIN PANEL
Ferritin: 557 ng/mL — ABNORMAL HIGH (ref 30–400)
Iron Saturation: 39 % (ref 15–55)
Iron: 127 ug/dL (ref 38–169)
Total Iron Binding Capacity: 329 ug/dL (ref 250–450)
UIBC: 202 ug/dL (ref 111–343)

## 2019-06-13 LAB — CELIAC DISEASE AB SCREEN W/RFX
Antigliadin Abs, IgA: 6 units (ref 0–19)
Transglutaminase IgA: <2 U/mL (ref 0–3)

## 2019-06-13 LAB — IMMUNOGLOBULINS A/E/G/M, SERUM
IgA/Immunoglobulin A, Serum: 267 mg/dL (ref 90–386)
IgE (Immunoglobulin E), Serum: 121 IU/mL (ref 6–495)
IgG (Immunoglobin G), Serum: 1012 mg/dL (ref 603–1613)
IgM (Immunoglobulin M), Srm: 104 mg/dL (ref 20–172)

## 2019-06-13 LAB — MITOCHONDRIAL/SMOOTH MUSCLE AB PNL
Mitochondrial Ab: <20 Units (ref 0.0–20.0)
Smooth Muscle Ab: 7 Units (ref 0–19)

## 2019-06-13 LAB — HEPATITIS B E ANTIBODY: Hep B E Ab: NEGATIVE

## 2019-06-13 LAB — ANA: Anti Nuclear Antibody (ANA): NEGATIVE

## 2019-06-13 LAB — HEPATITIS B CORE ANTIBODY, TOTAL: Hep B Core Total Ab: NEGATIVE

## 2019-06-13 LAB — HEPATITIS B SURFACE ANTIGEN: Hepatitis B Surface Ag: NEGATIVE

## 2019-06-13 LAB — HEPATITIS B SURFACE ANTIBODY,QUALITATIVE: Hep B Surface Ab, Qual: NONREACTIVE

## 2019-06-13 LAB — ALPHA-1-ANTITRYPSIN: A-1 Antitrypsin: 110 mg/dL (ref 95–164)

## 2019-06-13 LAB — CK: Total CK: 420 U/L (ref 49–439)

## 2019-06-13 LAB — HEPATITIS A ANTIBODY, TOTAL: hep A Total Ab: POSITIVE — AB

## 2019-06-13 LAB — ANTI-MICROSOMAL ANTIBODY LIVER / KIDNEY: LKM1 Ab: 0.6 Units (ref 0.0–20.0)

## 2019-06-13 LAB — GAMMA GT: GGT: 32 IU/L (ref 0–65)

## 2019-06-13 LAB — HEPATITIS B E ANTIGEN: Hep B E Ag: NEGATIVE

## 2019-06-16 NOTE — Progress Notes (Signed)
Established patient visit    Patient: Francisco Andrews   DOB: [birth year]   34 y.o. Male  MRN: [id number] Visit Date: 06/17/2019  Today's healthcare provider: Lelon Huh, MD   Chief Complaint  Patient presents with  . Diabetes   Subjective    HPI   [employer] as a scribe for Lelon Huh, MD.,have documented all relevant documentation on the behalf of Lelon Huh, MD,as directed by  Lelon Huh, MD while in the presence of Lelon Huh, MD.  Diabetes Mellitus Type II, Follow-up  Lab Results  Component Value Date   HGBA1C 7.9 (A) 02/14/2019   HGBA1C 6.4 (A) 08/23/2018   HGBA1C 6.4 (A) 02/25/2018   Last seen for diabetes 4 months ago.  Management since then includes advising patient to getting back on his regular workup regiment and losing weight.  He reports good compliance with treatment. He is not having side effects.  Symptoms: No fatigue No foot ulcerations No appetite changes No nausea No paresthesia (numbness or tingling) of the feet  No polydipsia (excessive thirst) No polyuria (frequent urination) No visual disturbances  No vomiting  Home blood sugar records: fasting range: not being checked at home  Episodes of hypoglycemia? No    Current insulin regiment: none Most Recent Eye Exam: N/A Current exercise: cardiovascular workout on exercise equipment Current diet habits: in general, a "healthy" diet    Pertinent Labs: Lab Results  Component Value Date   CHOL 230 (H) 10/05/2017   LDLCALC 129 (H) 10/05/2017   HDL 39 (L) 10/05/2017   TRIG 310 (H) 10/05/2017   NA 138 02/14/2019   K 4.3 02/14/2019   ALT 201 (H) 02/14/2019   AST 90 (H) 02/14/2019   CREATININE 0.82 02/14/2019   PLT 338 02/14/2019   Wt Readings from Last 3 Encounters:  06/17/19 229 lb 12.8 oz (104.2 kg)  06/10/19 226 lb 9.6 oz (102.8 kg)  02/14/19 233 lb (105.7 kg)     -----------------------------------------------------------------------------------------     Medications: Outpatient Medications Prior to Visit  Medication Sig  . fexofenadine (ALLEGRA) 180 MG tablet Take 180 mg by mouth daily as needed for allergies.   . metFORMIN (GLUCOPHAGE) 1000 MG tablet TAKE 1 TABLET BY MOUTH TWICE A DAY  . tadalafil (CIALIS) 5 MG tablet Take 1 tablet (5 mg total) by mouth daily.   No facility-administered medications prior to visit.    Review of Systems  Constitutional: Negative.   Respiratory: Negative.   Cardiovascular: Negative.   Hematological: Negative.        Objective    BP 133/82 (BP Location: Right Arm, Patient Position: Sitting, Cuff Size: Large)   Pulse 92   Temp (!) 97.1 F (36.2 C) (Temporal)   Wt 229 lb 12.8 oz (104.2 kg)   BMI 30.32 kg/m    Physical Exam   General: Appearance:    Obese male in no acute distress  Eyes:    PERRL, conjunctiva/corneas clear, EOM's intact       Lungs:     Clear to auscultation bilaterally, respirations unlabored  Heart:    Normal heart rate. Normal rhythm. No murmurs, rubs, or gallops.   MS:   All extremities are intact.   Neurologic:   Awake, alert, oriented x 3. No apparent focal neurological           defect.          Assessment & Plan    1. Type 2 diabetes mellitus with chronic kidney disease, without  long-term current use of insulin, unspecified CKD stage (HCC) Doing well on current medication regiment.   - Hemoglobin A1c   No follow-ups on file.      The entirety of the information documented in the History of Present Illness, Review of Systems and Physical Exam were personally obtained by me. Portions of this information were initially documented by the CMA and reviewed by me for thoroughness and accuracy.      Mila Merry, MD  [facility] [phone] (phone) [phone] (fax)  [facility]

## 2019-06-17 ENCOUNTER — Encounter: Payer: Self-pay | Admitting: Family Medicine

## 2019-06-17 ENCOUNTER — Other Ambulatory Visit: Payer: Self-pay

## 2019-06-17 ENCOUNTER — Ambulatory Visit: Payer: 59 | Admitting: Family Medicine

## 2019-06-17 VITALS — BP 133/82 | HR 92 | Temp 97.1°F | Wt 229.8 lb

## 2019-06-17 DIAGNOSIS — E119 Type 2 diabetes mellitus without complications: Secondary | ICD-10-CM

## 2019-06-17 DIAGNOSIS — E1122 Type 2 diabetes mellitus with diabetic chronic kidney disease: Secondary | ICD-10-CM

## 2019-06-18 LAB — HEMOGLOBIN A1C
Est. average glucose Bld gHb Est-mCnc: 166 mg/dL
Hgb A1c MFr Bld: 7.4 % — ABNORMAL HIGH (ref 4.8–5.6)

## 2019-06-29 ENCOUNTER — Encounter: Payer: Self-pay | Admitting: Gastroenterology

## 2019-06-30 ENCOUNTER — Telehealth: Payer: Self-pay

## 2019-06-30 NOTE — Telephone Encounter (Signed)
Called pt to inform him of lab result.  Unable to contact, LVM to return call

## 2019-06-30 NOTE — Telephone Encounter (Signed)
Patient called back. Please call patient back.

## 2019-06-30 NOTE — Telephone Encounter (Signed)
-----   Message from Wyline Mood, MD sent at 06/29/2019  7:16 PM EDT ----- Inform - needs hep B vaccine, rest of tests normal. RBC's in urine refer to urology   C/c Malva Limes, MD    Dr Wyline Mood MD,MRCP [facility]) Gastroenterology/Hepatology Pager: [phone]

## 2019-07-01 NOTE — Telephone Encounter (Signed)
Spoke with Francisco Andrews and informed him of lab results and Dr. Johnney Killian recommendations. Francisco Andrews understands and agrees. Francisco Andrews states he's going for his second dose of the Hep A vaccine he plans to request the Hep B vaccine as well.

## 2019-07-01 NOTE — Progress Notes (Signed)
07/02/19 12:46 PM   Francisco Andrews 20-Aug-1985 [id number]  Referring provider: Wyline Mood, MD [address] [place],  [place] [address] Chief Complaint  Patient presents with  . Hematuria    HPI: Francisco Andrews is a 34 y.o. M who presents today for the evaluation and management of hematuria.   Visited PCP on 06/10/19 c/o of bright red urine. F/u UA showed 3+ RBC.   He reports of gross hematuria w/ no associated pain x 2 weeks. He hasn't been able to see blood last week.   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   He denies burning w/ urination, groin injury, or any related trauma.   No personal hx or FHx of kidney stones or prostate cancer.   PMH: Past Medical History:  Diagnosis Date  . Allergy     Surgical History: No past surgical history on file.  Home Medications:  Allergies as of 07/02/2019      Reactions   Biaxin [clarithromycin] Hives, Itching   Penicillins Hives   Has patient had a PCN reaction causing immediate rash, facial/tongue/throat swelling, SOB or lightheadedness with hypotension: /No/ Has patient had a PCN reaction causing severe rash involving mucus membranes or skin necrosis: /No/ Has patient had a PCN reaction that required hospitalization: No/ Has patient had a PCN reaction occurring within the last 10 years: No/ If all of the above answers are "NO", then may proceed with Cephalosporin use.   Sulfa Antibiotics Hives, Itching, Swelling   Swelling in throat      Medication List       Accurate as of Jul 02, 2019 12:46 PM. If you have any questions, ask your nurse or doctor.        fexofenadine 180 MG tablet Commonly known as: ALLEGRA Take 180 mg by mouth daily as needed for allergies.   metFORMIN 1000 MG tablet Commonly known as: GLUCOPHAGE TAKE 1 TABLET BY MOUTH TWICE A DAY   tadalafil 5 MG tablet Commonly known as: CIALIS Take 1 tablet (5 mg total) by mouth daily.        Allergies:  Allergies  Allergen Reactions  . Biaxin [Clarithromycin] Hives and Itching  . Penicillins Hives    Has patient had a PCN reaction causing immediate rash, facial/tongue/throat swelling, SOB or lightheadedness with hypotension: /No/ Has patient had a PCN reaction causing severe rash involving mucus membranes or skin necrosis: /No/ Has patient had a PCN reaction that required hospitalization: No/ Has patient had a PCN reaction occurring within the last 10 years: No/ If all of the above answers are "NO", then may proceed with Cephalosporin use.   . Sulfa Antibiotics Hives, Itching and Swelling    Swelling in throat    Family History: Family History  Problem Relation Age of Onset  . Hypertension Mother   . Arthritis Mother   . Arthritis Father   . Hypertension Maternal Aunt   . Diabetes Maternal Uncle   . Hypertension Maternal Uncle   . Cancer Maternal Grandmother   . Diabetes Maternal Grandmother   . Diabetes Maternal Grandfather   . Hypertension Maternal Grandfather   . Heart disease Maternal Grandfather   . Heart disease Paternal Grandfather     Social History:  reports that he has never smoked. His smokeless tobacco use includes snuff. He reports previous alcohol use. He reports that he does not use drugs.   Physical Exam: BP 129/78  Pulse 93   Ht 6\' 1"  (1.854 m)   Wt 224 lb (101.6 kg)   BMI 29.55 kg/m   Constitutional:  Alert and oriented, No acute distress. HEENT: [place] AT, moist mucus membranes.  Trachea midline, no masses. Cardiovascular: No clubbing, cyanosis, or edema. Respiratory: Normal respiratory effort, no increased work of breathing. Skin: No rashes, bruises or suspicious lesions. Neurologic: Grossly intact, no focal deficits, moving all 4 extremities. Psychiatric: Normal mood and affect.  Laboratory Data:  Lab Results  Component Value Date   HGBA1C 7.4 (H) 06/17/2019   Urinalysis UA today revealed microscopic blood.   Assessment  & Plan:    1. Gross hematuria UA w/ persistent microscopic blood   We discussed the differential diagnosis for microscopic hematuria including nephrolithiasis, renal or upper tract tumors, bladder stones, UTIs, or bladder tumors as well as undetermined etiologies. Per AUA guidelines, I did recommend complete microscopic hematuria evaluation including CTU, possible urine cytology, and office cystoscopy.  Return for cysto in 2-3 week w/ CTU prior    [place] [address], [place], [place] [address] [phone]  I, Lucas Mallow, am acting as a scribe for Dr. Hollice Espy,  I have reviewed the above documentation for accuracy and completeness, and I agree with the above.   Hollice Espy, MD

## 2019-07-02 ENCOUNTER — Encounter: Payer: Self-pay | Admitting: Urology

## 2019-07-02 ENCOUNTER — Ambulatory Visit: Payer: 59 | Admitting: Urology

## 2019-07-02 ENCOUNTER — Other Ambulatory Visit: Payer: Self-pay

## 2019-07-02 VITALS — BP 129/78 | HR 93 | Ht 73.0 in | Wt 224.0 lb

## 2019-07-02 DIAGNOSIS — R319 Hematuria, unspecified: Secondary | ICD-10-CM

## 2019-07-02 NOTE — Patient Instructions (Signed)
Cystoscopy Cystoscopy is a procedure that is used to help diagnose and sometimes treat conditions that affect the lower urinary tract. The lower urinary tract includes the bladder and the urethra. The urethra is the tube that drains urine from the bladder. Cystoscopy is done using a thin, tube-shaped instrument with a light and camera at the end (cystoscope). The cystoscope may be hard or flexible, depending on the goal of the procedure. The cystoscope is inserted through the urethra, into the bladder. Cystoscopy may be recommended if you have:  Urinary tract infections that keep coming back.  Blood in the urine (hematuria).  An inability to control when you urinate (urinary incontinence) or an overactive bladder.  Unusual cells found in a urine sample.  A blockage in the urethra, such as a urinary stone.  Painful urination.  An abnormality in the bladder found during an intravenous pyelogram (IVP) or CT scan. Cystoscopy may also be done to remove a sample of tissue to be examined under a microscope (biopsy). What are the risks? Generally, this is a safe procedure. However, problems may occur, including:  Infection.  Bleeding.  What happens during the procedure?  1. You will be given one or more of the following: ? A medicine to numb the area (local anesthetic). 2. The area around the opening of your urethra will be cleaned. 3. The cystoscope will be passed through your urethra into your bladder. 4. Germ-free (sterile) fluid will flow through the cystoscope to fill your bladder. The fluid will stretch your bladder so that your health care provider can clearly examine your bladder walls. 5. Your doctor will look at the urethra and bladder. 6. The cystoscope will be removed The procedure may vary among health care providers  What can I expect after the procedure? After the procedure, it is common to have: 1. Some soreness or pain in your abdomen and urethra. 2. Urinary symptoms.  These include: ? Mild pain or burning when you urinate. Pain should stop within a few minutes after you urinate. This may last for up to 1 week. ? A small amount of blood in your urine for several days. ? Feeling like you need to urinate but producing only a small amount of urine. Follow these instructions at home: General instructions  Return to your normal activities as told by your health care provider.   Do not drive for 24 hours if you were given a sedative during your procedure.  Watch for any blood in your urine. If the amount of blood in your urine increases, call your health care provider.  If a tissue sample was removed for testing (biopsy) during your procedure, it is up to you to get your test results. Ask your health care provider, or the department that is doing the test, when your results will be ready.  Drink enough fluid to keep your urine pale yellow.  Keep all follow-up visits as told by your health care provider. This is important. Contact a health care provider if you:  Have pain that gets worse or does not get better with medicine, especially pain when you urinate.  Have trouble urinating.  Have more blood in your urine. Get help right away if you:  Have blood clots in your urine.  Have abdominal pain.  Have a fever or chills.  Are unable to urinate. Summary  Cystoscopy is a procedure that is used to help diagnose and sometimes treat conditions that affect the lower urinary tract.  Cystoscopy is done using   a thin, tube-shaped instrument with a light and camera at the end.  After the procedure, it is common to have some soreness or pain in your abdomen and urethra.  Watch for any blood in your urine. If the amount of blood in your urine increases, call your health care provider.  If you were prescribed an antibiotic medicine, take it as told by your health care provider. Do not stop taking the antibiotic even if you start to feel better. This  information is not intended to replace advice given to you by your health care provider. Make sure you discuss any questions you have with your health care provider. Document Revised: 02/05/2018 Document Reviewed: 02/05/2018 Elsevier Patient Education  2020 [employer]   

## 2019-07-03 LAB — URINALYSIS, COMPLETE
Bilirubin, UA: NEGATIVE
Glucose, UA: NEGATIVE
Ketones, UA: NEGATIVE
Leukocytes,UA: NEGATIVE
Nitrite, UA: NEGATIVE
Protein,UA: NEGATIVE
Specific Gravity, UA: 1.025 (ref 1.005–1.030)
Urobilinogen, Ur: 0.2 mg/dL (ref 0.2–1.0)
pH, UA: 6.5 (ref 5.0–7.5)

## 2019-07-03 LAB — MICROSCOPIC EXAMINATION
Bacteria, UA: NONE SEEN
Epithelial Cells (non renal): NONE SEEN /hpf (ref 0–10)
RBC, Urine: >30 /hpf — AB (ref 0–2)

## 2019-07-18 ENCOUNTER — Ambulatory Visit
Admission: RE | Admit: 2019-07-18 | Discharge: 2019-07-18 | Disposition: A | Discharge status: Home / Self Care | Payer: 59 | Source: Ambulatory Visit | Attending: Urology | Admitting: Urology

## 2019-07-18 ENCOUNTER — Other Ambulatory Visit: Payer: Self-pay

## 2019-07-18 DIAGNOSIS — R319 Hematuria, unspecified: Secondary | ICD-10-CM | POA: Diagnosis not present

## 2019-07-18 HISTORY — DX: Type 2 diabetes mellitus without complications: E11.9

## 2019-07-18 LAB — POCT I-STAT CREATININE: Creatinine, Ser: 0.8 mg/dL (ref 0.61–1.24)

## 2019-07-18 MED ORDER — IOHEXOL 300 MG/ML  SOLN
125.0000 mL | Freq: Once | INTRAMUSCULAR | Status: AC | PRN
  Administered 2019-07-18: 125 mL via INTRAVENOUS

## 2019-07-21 NOTE — H&P (View-Only) (Signed)
 07/22/19   CC:  Chief Complaint  Patient presents with  . Cysto    HPI: Francisco Andrews is a 34 y.o. M w/ hx of hematuria returns today for cysto.   Visited PCP on 06/10/19 c/o of bright red urine. F/u UA showed 3+ RBC.   CT hematuria from 07/18/19 revealed nonobstructive right nephrolithiasis. No hydronephrosis. No mass or urinary tract filling defect.  No personal hx or FHx of kidney stones or prostate cancer.   Today's Vitals   07/22/19 1447  BP: (!) 165/104  Pulse: (!) 102   There is no height or weight on file to calculate BMI. NED. A&Ox3.   No respiratory distress   Abd soft, NT, ND Normal phallus with bilateral descended testicles  Cystoscopy Procedure Note  Patient identification was confirmed, informed consent was obtained, and patient was prepped using Betadine solution.  Lidocaine jelly was administered per urethral meatus.     Pre-Procedure: - Inspection reveals a normal caliber ureteral meatus.  Procedure: The flexible cystoscope was introduced without difficulty - No urethral strictures/lesions are present. - Normal prostate  - Normal bladder neck - Bilateral ureteral orifices identified - Bladder mucosa  reveals no ulcers, tumors, or lesions - No bladder stones - No trabeculation  Retroflexion unremarkable  Post-Procedure: - Patient tolerated the procedure well  Pertinent Imagings:  CLINICAL DATA:  Gross hematuria  EXAM: CT ABDOMEN AND PELVIS WITHOUT AND WITH CONTRAST  TECHNIQUE: Multidetector CT imaging of the abdomen and pelvis was performed following the standard protocol before and following the bolus administration of intravenous contrast.  CONTRAST:  125mL OMNIPAQUE IOHEXOL 300 MG/ML  SOLN  COMPARISON:  None.  FINDINGS: Lower chest: No acute abnormality.  Hepatobiliary: No solid liver abnormality is seen. Hepatic steatosis. No gallstones, gallbladder wall thickening, or biliary dilatation.  Pancreas:  Unremarkable. No pancreatic ductal dilatation or surrounding inflammatory changes.  Spleen: Normal in size without significant abnormality.  Adrenals/Urinary Tract: Adrenal glands are unremarkable. Nonobstructive calculus of the inferior pole of the right kidney. No hydronephrosis. Bladder is unremarkable.  Stomach/Bowel: Stomach is within normal limits. Appendix appears normal. No evidence of bowel wall thickening, distention, or inflammatory changes.  Vascular/Lymphatic: No significant vascular findings are present. No enlarged abdominal or pelvic lymph nodes.  Reproductive: No mass or other significant abnormality.  Other: No abdominal wall hernia or abnormality. No abdominopelvic ascites.  Musculoskeletal: No acute or significant osseous findings.  IMPRESSION: 1. Nonobstructive right nephrolithiasis. No hydronephrosis. 2. No mass or urinary tract filling defect. 3. Hepatic steatosis.   Electronically Signed   By: Alex  Bibbey M.D.   On: 07/18/2019 11:41  I have personally reviewed the images and agree with radiologist interpretation.   Assessment/ Plan:  1. Nephrolithiasis  CT hematuria revealed right lower pole stone in kidney - ~5-6 mm asymptomatic  Observation vs intervention reviewed.  Interested in surgical management.    We discussed various treatment options including ESWL vs. ureteroscopy, laser lithotripsy, and stent. We discussed the risks and benefits of both including bleeding, infection, damage to surrounding structures, efficacy with need for possible further intervention, and need for temporary ureteral stent.  He has elected ESWL   Return for ESWL   2. Microscopic hematuria Possible secondary to above  CT urogram and cysto without pathology other than above  UA today revealed microscopic blood    I, Nethusan Sivanesan, am acting as a scribe for Dr. Serin Thornell,  I have reviewed the above documentation for accuracy and  completeness, and I   agree with the above.   Vanna Scotland, MD

## 2019-07-21 NOTE — Progress Notes (Signed)
07/22/19   CC:  Chief Complaint  Patient presents with  . Cysto    HPI: Francisco Andrews is a 34 y.o. M w/ hx of hematuria returns today for cysto.   Visited PCP on 06/10/19 c/o of bright red urine. F/u UA showed 3+ RBC.   CT hematuria from 07/18/19 revealed nonobstructive right nephrolithiasis. No hydronephrosis. No mass or urinary tract filling defect.  No personal hx or FHx of kidney stones or prostate cancer.   Today's Vitals   07/22/19 1447  BP: (!) 165/104  Pulse: (!) 102   There is no height or weight on file to calculate BMI. NED. A&Ox3.   No respiratory distress   Abd soft, NT, ND Normal phallus with bilateral descended testicles  Cystoscopy Procedure Note  Patient identification was confirmed, informed consent was obtained, and patient was prepped using Betadine solution.  Lidocaine jelly was administered per urethral meatus.     Pre-Procedure: - Inspection reveals a normal caliber ureteral meatus.  Procedure: The flexible cystoscope was introduced without difficulty - No urethral strictures/lesions are present. - Normal prostate  - Normal bladder neck - Bilateral ureteral orifices identified - Bladder mucosa  reveals no ulcers, tumors, or lesions - No bladder stones - No trabeculation  Retroflexion unremarkable  Post-Procedure: - Patient tolerated the procedure well  Pertinent Imagings:  CLINICAL DATA:  Gross hematuria  EXAM: CT ABDOMEN AND PELVIS WITHOUT AND WITH CONTRAST  TECHNIQUE: Multidetector CT imaging of the abdomen and pelvis was performed following the standard protocol before and following the bolus administration of intravenous contrast.  CONTRAST:  1100mL OMNIPAQUE IOHEXOL 300 MG/ML  SOLN  COMPARISON:  None.  FINDINGS: Lower chest: No acute abnormality.  Hepatobiliary: No solid liver abnormality is seen. Hepatic steatosis. No gallstones, gallbladder wall thickening, or biliary dilatation.  Pancreas:  Unremarkable. No pancreatic ductal dilatation or surrounding inflammatory changes.  Spleen: Normal in size without significant abnormality.  Adrenals/Urinary Tract: Adrenal glands are unremarkable. Nonobstructive calculus of the inferior pole of the right kidney. No hydronephrosis. Bladder is unremarkable.  Stomach/Bowel: Stomach is within normal limits. Appendix appears normal. No evidence of bowel wall thickening, distention, or inflammatory changes.  Vascular/Lymphatic: No significant vascular findings are present. No enlarged abdominal or pelvic lymph nodes.  Reproductive: No mass or other significant abnormality.  Other: No abdominal wall hernia or abnormality. No abdominopelvic ascites.  Musculoskeletal: No acute or significant osseous findings.  IMPRESSION: 1. Nonobstructive right nephrolithiasis. No hydronephrosis. 2. No mass or urinary tract filling defect. 3. Hepatic steatosis.   Electronically Signed   By: Eddie Candle M.D.   On: 07/18/2019 11:41  I have personally reviewed the images and agree with radiologist interpretation.   Assessment/ Plan:  1. Nephrolithiasis  CT hematuria revealed right lower pole stone in kidney - ~5-6 mm asymptomatic  Observation vs intervention reviewed.  Interested in surgical management.    We discussed various treatment options including ESWL vs. ureteroscopy, laser lithotripsy, and stent. We discussed the risks and benefits of both including bleeding, infection, damage to surrounding structures, efficacy with need for possible further intervention, and need for temporary ureteral stent.  He has elected ESWL   Return for ESWL   2. Microscopic hematuria Possible secondary to above  CT urogram and cysto without pathology other than above  UA today revealed microscopic blood    I, Nethusan Sivanesan, am acting as a scribe for Dr. Hollice Espy,  I have reviewed the above documentation for accuracy and  completeness, and I  agree with the above.   Vanna Scotland, MD

## 2019-07-22 ENCOUNTER — Encounter: Payer: Self-pay | Admitting: Urology

## 2019-07-22 ENCOUNTER — Ambulatory Visit: Payer: 59 | Admitting: Urology

## 2019-07-22 ENCOUNTER — Other Ambulatory Visit: Payer: Self-pay | Admitting: Radiology

## 2019-07-22 ENCOUNTER — Other Ambulatory Visit: Payer: Self-pay

## 2019-07-22 VITALS — BP 165/104 | HR 102

## 2019-07-22 DIAGNOSIS — R319 Hematuria, unspecified: Secondary | ICD-10-CM | POA: Diagnosis not present

## 2019-07-22 DIAGNOSIS — N200 Calculus of kidney: Secondary | ICD-10-CM

## 2019-07-23 ENCOUNTER — Other Ambulatory Visit: Payer: Self-pay | Admitting: Radiology

## 2019-07-23 DIAGNOSIS — N200 Calculus of kidney: Secondary | ICD-10-CM

## 2019-07-23 LAB — URINALYSIS, COMPLETE
Bilirubin, UA: NEGATIVE
Glucose, UA: NEGATIVE
Ketones, UA: NEGATIVE
Leukocytes,UA: NEGATIVE
Nitrite, UA: NEGATIVE
Protein,UA: NEGATIVE
Specific Gravity, UA: 1.01 (ref 1.005–1.030)
Urobilinogen, Ur: 0.2 mg/dL (ref 0.2–1.0)
pH, UA: 6.5 (ref 5.0–7.5)

## 2019-07-23 LAB — MICROSCOPIC EXAMINATION: Bacteria, UA: NONE SEEN

## 2019-07-31 ENCOUNTER — Ambulatory Visit: Payer: 59 | Admitting: Gastroenterology

## 2019-08-05 ENCOUNTER — Other Ambulatory Visit
Admission: RE | Admit: 2019-08-05 | Discharge: 2019-08-05 | Disposition: A | Discharge status: Home / Self Care | Payer: 59 | Source: Ambulatory Visit | Attending: Urology | Admitting: Urology

## 2019-08-05 ENCOUNTER — Encounter: Payer: Self-pay | Admitting: Urology

## 2019-08-05 ENCOUNTER — Other Ambulatory Visit: Payer: Self-pay

## 2019-08-05 DIAGNOSIS — Z01812 Encounter for preprocedural laboratory examination: Secondary | ICD-10-CM | POA: Insufficient documentation

## 2019-08-05 DIAGNOSIS — Z20822 Contact with and (suspected) exposure to covid-19: Secondary | ICD-10-CM | POA: Diagnosis not present

## 2019-08-05 LAB — SARS CORONAVIRUS 2 (TAT 6-24 HRS): SARS Coronavirus 2: NEGATIVE

## 2019-08-07 ENCOUNTER — Encounter
Admission: RE | Disposition: A | Discharge status: Home / Self Care | Payer: Self-pay | Source: Home / Self Care | Attending: Urology

## 2019-08-07 ENCOUNTER — Encounter: Payer: Self-pay | Admitting: Urology

## 2019-08-07 ENCOUNTER — Ambulatory Visit: Payer: 59

## 2019-08-07 ENCOUNTER — Ambulatory Visit
Admission: RE | Admit: 2019-08-07 | Discharge: 2019-08-07 | Disposition: A | Discharge status: Home / Self Care | Payer: 59 | Attending: Urology | Admitting: Urology

## 2019-08-07 ENCOUNTER — Other Ambulatory Visit: Payer: Self-pay

## 2019-08-07 DIAGNOSIS — N200 Calculus of kidney: Secondary | ICD-10-CM

## 2019-08-07 DIAGNOSIS — R3129 Other microscopic hematuria: Secondary | ICD-10-CM | POA: Insufficient documentation

## 2019-08-07 HISTORY — PX: EXTRACORPOREAL SHOCK WAVE LITHOTRIPSY: SHX1557

## 2019-08-07 LAB — GLUCOSE, CAPILLARY: Glucose-Capillary: 161 mg/dL — ABNORMAL HIGH (ref 70–99)

## 2019-08-07 SURGERY — LITHOTRIPSY, ESWL
Anesthesia: Moderate Sedation | Laterality: Right

## 2019-08-07 MED ORDER — ONDANSETRON HCL 4 MG/2ML IJ SOLN: 4.0000 mg | Freq: Once | INTRAMUSCULAR | Status: AC | PRN

## 2019-08-07 MED ORDER — DIAZEPAM 5 MG PO TABS: 10.0000 mg | ORAL_TABLET | ORAL | Status: AC

## 2019-08-07 MED ORDER — DIAZEPAM 5 MG PO TABS
ORAL_TABLET | ORAL | Status: AC
  Administered 2019-08-07: 10 mg via ORAL
  Filled 2019-08-07: qty 2

## 2019-08-07 MED ORDER — DIPHENHYDRAMINE HCL 25 MG PO CAPS: 25.0000 mg | ORAL_CAPSULE | ORAL | Status: AC

## 2019-08-07 MED ORDER — HYDROCODONE-ACETAMINOPHEN 5-325 MG PO TABS: 1.0000 | ORAL_TABLET | Freq: Four times a day (QID) | ORAL | 0 refills | Status: DC | PRN

## 2019-08-07 MED ORDER — TAMSULOSIN HCL 0.4 MG PO CAPS: 0.4000 mg | ORAL_CAPSULE | Freq: Every day | ORAL | 0 refills | Status: DC

## 2019-08-07 MED ORDER — ONDANSETRON HCL 4 MG/2ML IJ SOLN
INTRAMUSCULAR | Status: AC
  Administered 2019-08-07: 4 mg via INTRAVENOUS
  Filled 2019-08-07: qty 2

## 2019-08-07 MED ORDER — SODIUM CHLORIDE 0.9 % IV SOLN: INTRAVENOUS | Status: DC

## 2019-08-07 MED ORDER — CIPROFLOXACIN HCL 500 MG PO TABS: 500.0000 mg | ORAL_TABLET | ORAL | Status: AC

## 2019-08-07 MED ORDER — CIPROFLOXACIN HCL 500 MG PO TABS
ORAL_TABLET | ORAL | Status: AC
  Administered 2019-08-07: 500 mg via ORAL
  Filled 2019-08-07: qty 1

## 2019-08-07 MED ORDER — DIPHENHYDRAMINE HCL 25 MG PO CAPS
ORAL_CAPSULE | ORAL | Status: AC
  Administered 2019-08-07: 25 mg via ORAL
  Filled 2019-08-07: qty 1

## 2019-08-07 NOTE — Interval H&P Note (Signed)
History and Physical Interval Note:  08/07/2019 10:27 AM  [name]  has presented today for surgery, with the diagnosis of Kidney stone.  The various methods of treatment have been discussed with the patient and family. After consideration of risks, benefits and other options for treatment, the patient has consented to  Procedure(s): EXTRACORPOREAL SHOCK WAVE LITHOTRIPSY (ESWL) (Right) as a surgical intervention.  The patient's history has been reviewed, patient examined, no change in status, stable for surgery.  I have reviewed the patient's chart and labs.  Questions were answered to the patient's satisfaction.     Francisco Andrews

## 2019-08-07 NOTE — Discharge Instructions (Signed)
See [facility] discharge instructions in chart.  

## 2019-08-08 ENCOUNTER — Encounter: Payer: Self-pay | Admitting: Urology

## 2019-08-10 ENCOUNTER — Encounter: Payer: Self-pay | Admitting: Urology

## 2019-08-11 ENCOUNTER — Telehealth: Payer: Self-pay | Admitting: *Deleted

## 2019-08-11 NOTE — Telephone Encounter (Signed)
Spoke to patient-symptoms have improved, aware if symptoms worsen to contact the office. Voiced understanding.

## 2019-08-11 NOTE — Telephone Encounter (Signed)
error 

## 2019-08-21 NOTE — Progress Notes (Signed)
08/22/2019 8:12 AM   Francisco Andrews September 05, 1985 [id number]  Referring provider: Malva Limes, MD [address] [place],  [place] [address]  Chief Complaint  Patient presents with  . Nephrolithiasis    HPI: Francisco Andrews is a 34 y.o. who is status post ESWL who presents today for follow up.  Underwent ESWL on 08/07/2019 for right renal stone with Dr. Apolinar Junes.  Their postprocedural course was as expected and uneventful.   They have passed fragments.   They did not bring in fragments for analysis.   He will drop off the stone later.    KUB 08/22/2019 no stone visualized.    UA 08/22/2019 3-10 RBC's   PMH: Past Medical History:  Diagnosis Date  . Allergy   . Diabetes mellitus without complication [facility])     Surgical History: Past Surgical History:  Procedure Laterality Date  . EXTRACORPOREAL SHOCK WAVE LITHOTRIPSY Right 08/07/2019   Procedure: EXTRACORPOREAL SHOCK WAVE LITHOTRIPSY (ESWL);  Surgeon: Vanna Scotland, MD;  Location: [facility] ORS;  Service: Urology;  Laterality: Right;    Home Medications:  Current Outpatient Medications on File Prior to Visit  Medication Sig Dispense Refill  . metFORMIN (GLUCOPHAGE) 1000 MG tablet TAKE 1 TABLET BY MOUTH TWICE A DAY 60 tablet 11  . tadalafil (CIALIS) 5 MG tablet Take 1 tablet (5 mg total) by mouth daily. 30 tablet 5   No current facility-administered medications on file prior to visit.    Allergies:  Allergies  Allergen Reactions  . Biaxin [Clarithromycin] Hives and Itching  . Penicillins Hives    Has patient had a PCN reaction causing immediate rash, facial/tongue/throat swelling, SOB or lightheadedness with hypotension: /No/ Has patient had a PCN reaction causing severe rash involving mucus membranes or skin necrosis: /No/ Has patient had a PCN reaction that required hospitalization: No/ Has patient had a PCN reaction occurring within the last 10 years: No/ If all of the above answers are "NO", then may  proceed with Cephalosporin use.   . Sulfa Antibiotics Hives, Itching and Swelling    Swelling in throat    Family History: Family History  Problem Relation Age of Onset  . Hypertension Mother   . Arthritis Mother   . Arthritis Father   . Hypertension Maternal Aunt   . Diabetes Maternal Uncle   . Hypertension Maternal Uncle   . Cancer Maternal Grandmother   . Diabetes Maternal Grandmother   . Diabetes Maternal Grandfather   . Hypertension Maternal Grandfather   . Heart disease Maternal Grandfather   . Heart disease Paternal Grandfather     Social History:  reports that he has never smoked. His smokeless tobacco use includes snuff. He reports previous alcohol use. He reports that he does not use drugs.  ROS: Pertinent ROS in HPI  Physical Exam: BP (!) 143/93   Pulse 91   Ht 6' (1.829 m)   Wt 225 lb (102.1 kg)   BMI 30.52 kg/m   Constitutional:  Well nourished. Alert and oriented, No acute distress. HEENT: [place] AT, mask in place.   Trachea midline. Cardiovascular: No clubbing, cyanosis, or edema. Respiratory: Normal respiratory effort, no increased work of breathing. Neurologic: Grossly intact, no focal deficits, moving all 4 extremities. Psychiatric: Normal mood and affect.  Laboratory Data: Lab Results  Component Value Date   WBC 8.8 02/14/2019   HGB 16.1 02/14/2019   HCT 48.3 02/14/2019   MCV 88 02/14/2019   PLT 338 02/14/2019    Lab Results  Component Value Date   CREATININE 0.80 07/18/2019       Component Value Date/Time   CHOL 230 (H) 10/05/2017 1423   HDL 39 (L) 10/05/2017 1423   CHOLHDL 5.9 (H) 10/05/2017 1423   LDLCALC 129 (H) 10/05/2017 1423    Urinalysis Component     Latest Ref Rng & Units 08/22/2019  Specific Gravity, UA     1.005 - 1.030 1.020  pH, UA     5.0 - 7.5 7.0  Color, UA     Yellow Yellow  Appearance Ur     Clear Clear  Leukocytes,UA     Negative Negative  Protein,UA     Negative/Trace Negative  Glucose, UA     Negative  Negative  Ketones, UA     Negative Trace (A)  RBC, UA     Negative Trace (A)  Bilirubin, UA     Negative Negative  Urobilinogen, Ur     0.2 - 1.0 mg/dL 0.2  Nitrite, UA     Negative Negative  Microscopic Examination      See below:   Component     Latest Ref Rng & Units 08/22/2019  WBC, UA     0 - 5 /hpf 0-5  RBC     0 - 2 /hpf 3-10 (A)  Epithelial Cells (non renal)     0 - 10 /hpf None seen  Bacteria, UA     None seen/Few None seen    I have reviewed the labs.   Pertinent Imaging: CLINICAL DATA:  Right renal stone  EXAM: ABDOMEN - 1 VIEW  COMPARISON:  08/07/2019  FINDINGS: Previously noted calculus projecting over the right renal pelvis is not noted on current examination, possibly due to obscuring overlying bowel gas. No other radiographic evidence of renal calculus. Nonobstructive pattern of bowel gas.  IMPRESSION: Previously noted calculus projecting over the right renal pelvis is not noted on current examination, possibly due to obscuring overlying bowel gas. No other radiographic evidence of renal calculus. CT is the test of choice for the assessment of known or suspected urinary tract calculi.   Electronically Signed   By: Eddie Candle M.D.   On: 08/23/2019 09:30 I have independently reviewed the films.  See HPI.     Assessment & Plan:    1. Right renal stone - patient will bring in stone for analysis  2. Right hydronephrosis - obtain RUS to ensure the hydronephrosis has resolved once they have passed and/or recovered from procedure to ensure to iatrogenic hydronephrosis remains - it is explained to the patient that it is important to document resolution of the hydronephrosis as "silent hydronephrosis" can occur and cause damage and/or loss of the kidney - he defers at this time due to medical bills  3. Gross hematuria - UA today demonstrates 3-10 RBC's  - continue to monitor the patient's UA after the treatment/passage of the stone to  ensure the hematuria has resolved - if hematuria persists, we will pursue a hematuria workup with CT Urogram and cystoscopy if appropriate.  Return if symptoms worsen or fail to improve.  These notes generated with voice recognition software. I apologize for typographical errors.  Zara Council, PA-C  [facility] [address]  [place], [place] [address] [phone]

## 2019-08-22 ENCOUNTER — Ambulatory Visit: Payer: 59 | Admitting: Urology

## 2019-08-22 ENCOUNTER — Encounter: Payer: Self-pay | Admitting: Urology

## 2019-08-22 ENCOUNTER — Ambulatory Visit
Admission: RE | Admit: 2019-08-22 | Discharge: 2019-08-22 | Disposition: A | Discharge status: Home / Self Care | Payer: 59 | Attending: Urology | Admitting: Urology

## 2019-08-22 ENCOUNTER — Ambulatory Visit
Admission: RE | Admit: 2019-08-22 | Discharge: 2019-08-22 | Disposition: A | Discharge status: Home / Self Care | Payer: 59 | Source: Ambulatory Visit | Attending: Urology | Admitting: Urology

## 2019-08-22 VITALS — BP 143/93 | HR 91 | Ht 72.0 in | Wt 225.0 lb

## 2019-08-22 DIAGNOSIS — N200 Calculus of kidney: Secondary | ICD-10-CM | POA: Insufficient documentation

## 2019-08-22 DIAGNOSIS — N132 Hydronephrosis with renal and ureteral calculous obstruction: Secondary | ICD-10-CM

## 2019-08-22 DIAGNOSIS — R310 Gross hematuria: Secondary | ICD-10-CM

## 2019-08-25 LAB — URINALYSIS, COMPLETE
Bilirubin, UA: NEGATIVE
Glucose, UA: NEGATIVE
Leukocytes,UA: NEGATIVE
Nitrite, UA: NEGATIVE
Protein,UA: NEGATIVE
Specific Gravity, UA: 1.02 (ref 1.005–1.030)
Urobilinogen, Ur: 0.2 mg/dL (ref 0.2–1.0)
pH, UA: 7 (ref 5.0–7.5)

## 2019-08-25 LAB — MICROSCOPIC EXAMINATION
Bacteria, UA: NONE SEEN
Epithelial Cells (non renal): NONE SEEN /hpf (ref 0–10)

## 2019-08-26 ENCOUNTER — Other Ambulatory Visit: Payer: Self-pay

## 2019-08-26 ENCOUNTER — Ambulatory Visit (INDEPENDENT_AMBULATORY_CARE_PROVIDER_SITE_OTHER): Payer: 59

## 2019-08-26 VITALS — Temp 97.1°F

## 2019-08-26 DIAGNOSIS — Z23 Encounter for immunization: Secondary | ICD-10-CM

## 2019-08-27 ENCOUNTER — Encounter: Payer: Self-pay | Admitting: Adult Health

## 2019-08-27 DIAGNOSIS — Z23 Encounter for immunization: Secondary | ICD-10-CM | POA: Diagnosis not present

## 2019-08-27 NOTE — Patient Instructions (Signed)
Hepatitis A Vaccine, Inactivated suspension for injection What is this medicine? HEPATITIS A VACCINE (hep uh TAHY tis A VAK seen) is a vaccine to protect from an infection with the hepatitis A virus. This vaccine does not contain the live virus. It will not cause a hepatitis infection. This vaccine is also used with immunoglobulin to prevent infection in people who have been exposed to hepatitis A. This medicine may be used for other purposes; ask your health care provider or pharmacist if you have questions. COMMON BRAND NAME(S): Havrix, Vaqta What should I tell my health care provider before I take this medicine? They need to know if you have any of these conditions:  bleeding disorder  fever or infection  heart disease  immune system problems  an unusual or allergic reaction to hepatitis A vaccine, latex, neomycin, other medicines, foods, dyes, or preservatives  pregnant or trying to get pregnant  breast-feeding How should I use this medicine? This vaccine is for injection into a muscle. It is given by a health care professional. A copy of Vaccine Information Statements will be given before each vaccination. Read this sheet carefully each time. The sheet may change frequently. Talk to your pediatrician regarding the use of this medicine in children. While this drug may be prescribed for children as young as 21 months of age for selected conditions, precautions do apply. Overdosage: If you think you have taken too much of this medicine contact a poison control center or emergency room at once. NOTE: This medicine is only for you. Do not share this medicine with others. What if I miss a dose? This does not apply. What may interact with this medicine?  medicines to treat cancer  medicines that suppress your immune function like adalimumab, anakinra, etanercept, infliximab  steroid medicines like prednisone or cortisone This list may not describe all possible interactions. Give  your health care provider a list of all the medicines, herbs, non-prescription drugs, or dietary supplements you use. Also tell them if you smoke, drink alcohol, or use illegal drugs. Some items may interact with your medicine. What should I watch for while using this medicine? See your health care provider for a booster shot of this vaccine as directed. Tell your doctor right away if you have any serious or unusual side effects after getting this vaccine. You will not have protection from the hepatitis A virus for at least 8 to 10 days after your first injection. The length of time you will have protection from hepatitis A virus infection is not known. Check with your doctor if you have questions about your immunity. See your doctor before you travel out of the country. What side effects may I notice from receiving this medicine? Side effects that you should report to your doctor or health care professional as soon as possible:  allergic reactions like skin rash, itching or hives, swelling of the face, lips, or tongue  breathing problems  seizures  yellowing of the eyes or skin Side effects that usually do not require medical attention (report to your doctor or health care professional if they continue or are bothersome):  diarrhea  fever  loss of appetite  muscle pain  nausea  pain, redness, swelling or irritation at site where injected  tiredness This list may not describe all possible side effects. Call your doctor for medical advice about side effects. You may report side effects to FDA at 1-800-FDA-1088. Where should I keep my medicine? This drug is given in a hospital  or clinic and will not be stored at home. NOTE: This sheet is a summary. It may not cover all possible information. If you have questions about this medicine, talk to your doctor, pharmacist, or health care provider.  2020 Elsevier/Gold Standard (2013-06-16 13:19:40)

## 2019-08-27 NOTE — Progress Notes (Signed)
     Established patient visit   Patient: Francisco Andrews   DOB: 1985-07-14   33 y.o. Male  MRN: [id number] Visit Date: 08/26/2019  Today's healthcare provider: Jairo Ben, FNP   Chief Complaint  Patient presents with  . Immunizations   Subjective    HPI  Patient comes into office today to update immunization(s), patient reports that they feel well today and have no complaints or concerns to address. Immunization record has been reviewed with patient and information handout in regards to vaccine has been given. Patient was observed after injection and tolerated well with no adverse reaction or side effects.   Immunization History  Administered Date(s) Administered  . Hepatitis A, Adult 02/25/2019, 08/27/2019     There are no problems to display for this patient.  Past Medical History:  Diagnosis Date  . Allergy   . Diabetes mellitus without complication (HCC)    Social History   Tobacco Use  . Smoking status: Never Smoker  . Smokeless tobacco: Current User    Types: Snuff  . Tobacco comment: chews tobacco  Substance Use Topics  . Alcohol use: Not Currently  . Drug use: Never   Allergies  Allergen Reactions  . Biaxin [Clarithromycin] Hives and Itching  . Penicillins Hives    Has patient had a PCN reaction causing immediate rash, facial/tongue/throat swelling, SOB or lightheadedness with hypotension: /No/ Has patient had a PCN reaction causing severe rash involving mucus membranes or skin necrosis: /No/ Has patient had a PCN reaction that required hospitalization: No/ Has patient had a PCN reaction occurring within the last 10 years: No/ If all of the above answers are "NO", then may proceed with Cephalosporin use.   . Sulfa Antibiotics Hives, Itching and Swelling    Swelling in throat       Medications: Outpatient Medications Prior to Visit  Medication Sig  . metFORMIN (GLUCOPHAGE) 1000 MG tablet TAKE 1 TABLET BY MOUTH TWICE A DAY  .  tadalafil (CIALIS) 5 MG tablet Take 1 tablet (5 mg total) by mouth daily.   No facility-administered medications prior to visit.    Review of Systems  Constitutional: Negative.   HENT: Negative.   Cardiovascular: Negative.   Gastrointestinal: Negative.   Musculoskeletal: Negative.   Psychiatric/Behavioral: Negative.       Objective    Temp (!) 97.1 F (36.2 C) (Oral)    Physical Exam    No results found for any visits on 08/26/19.  Assessment & Plan       No follow-ups on file.         Jairo Ben, FNP  [facility] [phone] (phone) [phone] (fax)  [facility]

## 2019-08-30 ENCOUNTER — Other Ambulatory Visit: Payer: Self-pay | Admitting: Urology

## 2019-12-12 ENCOUNTER — Ambulatory Visit: Payer: 59 | Admitting: Family Medicine

## 2019-12-16 ENCOUNTER — Other Ambulatory Visit: Payer: Self-pay | Admitting: Family Medicine

## 2019-12-16 DIAGNOSIS — E1122 Type 2 diabetes mellitus with diabetic chronic kidney disease: Secondary | ICD-10-CM

## 2019-12-16 NOTE — Telephone Encounter (Signed)
Requested Prescriptions  Pending Prescriptions Disp Refills  . metFORMIN (GLUCOPHAGE) 1000 MG tablet [Pharmacy Med Name: METFORMIN HCL 1,000 MG TABLET] 60 tablet 0    Sig: TAKE 1 TABLET BY MOUTH TWICE A DAY     Endocrinology:  Diabetes - Biguanides Failed - 12/16/2019  1:29 AM      Failed - HBA1C is between 0 and 7.9 and within 180 days    Hgb A1c MFr Bld  Date Value Ref Range Status  06/17/2019 7.4 (H) 4.8 - 5.6 % Final    Comment:             Prediabetes: 5.7 - 6.4          Diabetes: >6.4          Glycemic control for adults with diabetes: <7.0          Failed - Valid encounter within last 6 months    Recent Outpatient Visits          6 months ago Type 2 diabetes mellitus with chronic kidney disease, without long-term current use of insulin, unspecified CKD stage [facility])   [facility] Birdie Sons, MD   10 months ago Type 2 diabetes mellitus with chronic kidney disease, without long-term current use of insulin, unspecified CKD stage [facility])   [place], Donald E, MD   1 year ago Type 2 diabetes mellitus without complication, without long-term current use of insulin ([place])   [facility] Birdie Sons, MD   1 year ago Type 2 diabetes mellitus with chronic kidney disease, without long-term current use of insulin, unspecified CKD stage [facility])   [place], Donald E, MD   2 years ago Type 2 diabetes mellitus with chronic kidney disease, without long-term current use of insulin, unspecified CKD stage [facility])   [place], Donald E, MD      Future Appointments            In 2 weeks Fisher, Kirstie Peri, MD [facility]           Passed - Cr in normal range and within 360 days    Creatinine, Ser  Date Value Ref Range Status  07/18/2019 0.80 0.61 - 1.24 mg/dL Final         Passed - eGFR in normal range and within 360 days    GFR calc Af Amer  Date Value Ref Range  Status  02/14/2019 135 >59 mL/min/1.73 Final   GFR calc non Af Amer  Date Value Ref Range Status  02/14/2019 117 >59 mL/min/1.73 Final

## 2019-12-17 ENCOUNTER — Telehealth: Payer: Self-pay | Admitting: Family Medicine

## 2019-12-17 DIAGNOSIS — R7401 Elevation of levels of liver transaminase levels: Secondary | ICD-10-CM

## 2019-12-17 DIAGNOSIS — E1122 Type 2 diabetes mellitus with diabetic chronic kidney disease: Secondary | ICD-10-CM

## 2019-12-17 NOTE — Telephone Encounter (Signed)
Please advise patient he needs labs done before his visit on November 2. His appt will be at 4pm which is too late in the day to get labs. Have placed future orders. He does not have to be fasting.

## 2019-12-18 NOTE — Telephone Encounter (Signed)
Tried calling patient. Left message to call back. OK for [facility] triage to advise then route message back to office.

## 2019-12-19 ENCOUNTER — Telehealth: Payer: Self-pay | Admitting: Family Medicine

## 2019-12-19 NOTE — Telephone Encounter (Signed)
Attempted to contact patient with message from Dr Sherrie Mustache. Left VM to return call to office.

## 2019-12-19 NOTE — Telephone Encounter (Signed)
Copied from CRM [id number]. Topic: Quick Communication - See Telephone Encounter >> Dec 18, 2019 10:57 AM Benjiman Core, CMA wrote: CRM for notification. See Telephone encounter for: 12/17/19. OK for [facility] triage to advise.

## 2019-12-22 NOTE — Telephone Encounter (Signed)
Pt. Given message. States his work schedule makes it hard for him to make "it to the lab when they are ope. I'll try my best."

## 2019-12-22 NOTE — Telephone Encounter (Signed)
Attempted to contact patient with PCP message. Left message on VM- to call office.

## 2019-12-30 ENCOUNTER — Other Ambulatory Visit: Payer: Self-pay

## 2019-12-30 ENCOUNTER — Ambulatory Visit: Payer: 59 | Admitting: Family Medicine

## 2019-12-30 ENCOUNTER — Encounter: Payer: Self-pay | Admitting: Family Medicine

## 2019-12-30 VITALS — BP 127/86 | HR 89 | Temp 98.1°F | Resp 16 | Wt 225.8 lb

## 2019-12-30 DIAGNOSIS — E119 Type 2 diabetes mellitus without complications: Secondary | ICD-10-CM | POA: Diagnosis not present

## 2019-12-30 DIAGNOSIS — E1122 Type 2 diabetes mellitus with diabetic chronic kidney disease: Secondary | ICD-10-CM

## 2019-12-30 DIAGNOSIS — K760 Fatty (change of) liver, not elsewhere classified: Secondary | ICD-10-CM

## 2019-12-30 DIAGNOSIS — R7401 Elevation of levels of liver transaminase levels: Secondary | ICD-10-CM

## 2019-12-30 NOTE — Patient Instructions (Addendum)
Please review the attached list of medications and notify my office if there are any errors.   Please contact your eyecare professional to schedule a routine eye exam. I recommend seeing Dr. Keith Nice at [phone] or the [facility] at [phone]  

## 2019-12-30 NOTE — Progress Notes (Signed)
Established patient visit   Patient: Francisco Andrews   DOB: 03-07-1985   34 y.o. Male  MRN: [id number] Visit Date: 12/30/2019  Today's healthcare provider: Lelon Huh, MD   Chief Complaint  Patient presents with  . Diabetes   Subjective    HPI  Diabetes Mellitus Type II, Follow-up  Lab Results  Component Value Date   HGBA1C 7.4 (H) 06/17/2019   HGBA1C 7.9 (A) 02/14/2019   HGBA1C 6.4 (A) 08/23/2018   Wt Readings from Last 3 Encounters:  12/30/19 225 lb 12.8 oz (102.4 kg)  08/22/19 225 lb (102.1 kg)  08/07/19 220 lb (99.8 kg)   Last seen for diabetes 6 months ago.  Management since then includes continue same medication. He reports good compliance with treatment. He is not having side effects.  Symptoms: No fatigue No foot ulcerations  No appetite changes No nausea  No paresthesia of the feet  No polydipsia  No polyuria No visual disturbances   No vomiting     Home blood sugar records: blood sugars are not checked  Episodes of hypoglycemia? No    Current insulin regiment: none Most Recent Eye Exam: >1 year ago Current exercise: heavy weight lifting Current diet habits: well balanced  Pertinent Labs: Lab Results  Component Value Date   CHOL 230 (H) 10/05/2017   HDL 39 (L) 10/05/2017   LDLCALC 129 (H) 10/05/2017   TRIG 310 (H) 10/05/2017   CHOLHDL 5.9 (H) 10/05/2017   Lab Results  Component Value Date   NA 138 02/14/2019   K 4.3 02/14/2019   CREATININE 0.80 07/18/2019   GFRNONAA 117 02/14/2019   GFRAA 135 02/14/2019   GLUCOSE 158 (H) 02/14/2019     ---------------------------------------------------------------------------------------------------     Medications: Outpatient Medications Prior to Visit  Medication Sig  . metFORMIN (GLUCOPHAGE) 1000 MG tablet TAKE 1 TABLET BY MOUTH TWICE A DAY  . tadalafil (CIALIS) 5 MG tablet Take 1 tablet (5 mg total) by mouth daily.   No facility-administered medications prior to visit.    Review  of Systems    Objective    BP 127/86 (BP Location: Left Arm, Patient Position: Sitting, Cuff Size: Large)   Pulse 89   Temp 98.1 F (36.7 C) (Oral)   Resp 16   Wt 225 lb 12.8 oz (102.4 kg)   BMI 30.62 kg/m    Physical Exam   General: Appearance:    Overweight male in no acute distress  Eyes:    PERRL, conjunctiva/corneas clear, EOM's intact       Lungs:     Clear to auscultation bilaterally, respirations unlabored  Heart:    Normal heart rate. Normal rhythm. No murmurs, rubs, or gallops.   MS:   All extremities are intact.   Neurologic:   Awake, alert, oriented x 3. No apparent focal neurological           defect.          Assessment & Plan     1. Type 2 diabetes mellitus without complication, without long-term current use of insulin ([place]) Doing well with metformin. He had all routine labs drawn today. Encouraged to start using his home glucometer a few times every week. He anticipates getting flu shot at work.  He states he has had covid vaccine.   2. Hepatic steatosis Checking met c today.       The entirety of the information documented in the History of Present Illness, Review of Systems and Physical Exam  were personally obtained by me. Portions of this information were initially documented by the CMA and reviewed by me for thoroughness and accuracy.      Lelon Huh, MD  [facility] [phone] (phone) [phone] (fax)  [place]

## 2019-12-31 LAB — COMPREHENSIVE METABOLIC PANEL
ALT: 33 IU/L (ref 0–44)
AST: 22 IU/L (ref 0–40)
Albumin/Globulin Ratio: 1.8 (ref 1.2–2.2)
Albumin: 4.8 g/dL (ref 4.0–5.0)
Alkaline Phosphatase: 82 IU/L (ref 44–121)
BUN/Creatinine Ratio: 13 (ref 9–20)
BUN: 12 mg/dL (ref 6–20)
Bilirubin Total: 0.6 mg/dL (ref 0.0–1.2)
CO2: 26 mmol/L (ref 20–29)
Calcium: 9.7 mg/dL (ref 8.7–10.2)
Chloride: 96 mmol/L (ref 96–106)
Creatinine, Ser: 0.9 mg/dL (ref 0.76–1.27)
GFR calc Af Amer: 129 mL/min/{1.73_m2} (ref >59)
GFR calc non Af Amer: 112 mL/min/{1.73_m2} (ref >59)
Globulin, Total: 2.6 g/dL (ref 1.5–4.5)
Glucose: 109 mg/dL — ABNORMAL HIGH (ref 65–99)
Potassium: 4.4 mmol/L (ref 3.5–5.2)
Sodium: 137 mmol/L (ref 134–144)
Total Protein: 7.4 g/dL (ref 6.0–8.5)

## 2019-12-31 LAB — CBC
Hematocrit: 46.5 % (ref 37.5–51.0)
Hemoglobin: 15.4 g/dL (ref 13.0–17.7)
MCH: 29.3 pg (ref 26.6–33.0)
MCHC: 33.1 g/dL (ref 31.5–35.7)
MCV: 88 fL (ref 79–97)
Platelets: 334 10*3/uL (ref 150–450)
RBC: 5.26 x10E6/uL (ref 4.14–5.80)
RDW: 12 % (ref 11.6–15.4)
WBC: 8.4 10*3/uL (ref 3.4–10.8)

## 2019-12-31 LAB — HEMOGLOBIN A1C
Est. average glucose Bld gHb Est-mCnc: 154 mg/dL
Hgb A1c MFr Bld: 7 % — ABNORMAL HIGH (ref 4.8–5.6)

## 2019-12-31 LAB — TSH: TSH: 3.32 u[IU]/mL (ref 0.450–4.500)

## 2019-12-31 LAB — LDL CHOLESTEROL, DIRECT: LDL Direct: 140 mg/dL — ABNORMAL HIGH (ref 0–99)

## 2020-01-05 ENCOUNTER — Encounter: Payer: Self-pay | Admitting: Adult Health

## 2020-01-08 ENCOUNTER — Other Ambulatory Visit: Payer: Self-pay | Admitting: Family Medicine

## 2020-01-08 DIAGNOSIS — E1122 Type 2 diabetes mellitus with diabetic chronic kidney disease: Secondary | ICD-10-CM

## 2020-01-08 MED ORDER — METFORMIN HCL 1000 MG PO TABS: 1000.0000 mg | ORAL_TABLET | Freq: Two times a day (BID) | ORAL | 5 refills | Status: DC

## 2020-07-07 ENCOUNTER — Other Ambulatory Visit: Payer: Self-pay | Admitting: Family Medicine

## 2020-07-07 DIAGNOSIS — E1122 Type 2 diabetes mellitus with diabetic chronic kidney disease: Secondary | ICD-10-CM

## 2020-07-07 NOTE — Telephone Encounter (Signed)
Patient needs an appointment for further refills. Courtesy refill. Requested Prescriptions  Pending Prescriptions Disp Refills  . metFORMIN (GLUCOPHAGE) 1000 MG tablet [Pharmacy Med Name: METFORMIN HCL 1,000 MG TABLET] 60 tablet 0    Sig: TAKE 1 TABLET BY MOUTH TWICE A DAY     Endocrinology:  Diabetes - Biguanides Failed - 07/07/2020  2:55 AM      Failed - HBA1C is between 0 and 7.9 and within 180 days    Hgb A1c MFr Bld  Date Value Ref Range Status  12/30/2019 7.0 (H) 4.8 - 5.6 % Final    Comment:             Prediabetes: 5.7 - 6.4          Diabetes: >6.4          Glycemic control for adults with diabetes: <7.0          Failed - Valid encounter within last 6 months    Recent Outpatient Visits          6 months ago Type 2 diabetes mellitus without complication, without long-term current use of insulin ([place])   [facility] Birdie Sons, MD   1 year ago Type 2 diabetes mellitus with chronic kidney disease, without long-term current use of insulin, unspecified CKD stage ([place])   [place], Kirstie Peri, MD   1 year ago Type 2 diabetes mellitus with chronic kidney disease, without long-term current use of insulin, unspecified CKD stage [facility])   [place], Donald E, MD   1 year ago Type 2 diabetes mellitus without complication, without long-term current use of insulin ([place])   [facility] Birdie Sons, MD   2 years ago Type 2 diabetes mellitus with chronic kidney disease, without long-term current use of insulin, unspecified CKD stage [facility])   [facility] Birdie Sons, MD             Passed - Cr in normal range and within 360 days    Creatinine, Ser  Date Value Ref Range Status  12/30/2019 0.90 0.76 - 1.27 mg/dL Final         Passed - eGFR in normal range and within 360 days    GFR calc Af Amer  Date Value Ref Range Status  12/30/2019 129 >59 mL/min/1.73 Final    Comment:    **In  accordance with recommendations from the NKF-ASN Task force,**   [facility] is in the process of updating its eGFR calculation to the   2021 CKD-EPI creatinine equation that estimates kidney function   without a race variable.    GFR calc non Af Amer  Date Value Ref Range Status  12/30/2019 112 >59 mL/min/1.73 Final

## 2020-08-05 ENCOUNTER — Other Ambulatory Visit: Payer: Self-pay | Admitting: Family Medicine

## 2020-08-05 DIAGNOSIS — E1122 Type 2 diabetes mellitus with diabetic chronic kidney disease: Secondary | ICD-10-CM

## 2020-08-05 NOTE — Telephone Encounter (Signed)
Requested medications are due for refill today.  yes  Requested medications are on the active medications list.  yes  Last refill. 07/07/2020  Future visit scheduled.   No   Notes to clinic.  Courtesy refill already given.

## 2020-08-09 ENCOUNTER — Encounter: Payer: Self-pay | Admitting: Family Medicine

## 2020-08-09 DIAGNOSIS — E1122 Type 2 diabetes mellitus with diabetic chronic kidney disease: Secondary | ICD-10-CM

## 2020-08-10 MED ORDER — METFORMIN HCL 1000 MG PO TABS: 1.0000 | ORAL_TABLET | Freq: Two times a day (BID) | ORAL | 0 refills | Status: DC

## 2020-08-23 ENCOUNTER — Telehealth: Payer: Self-pay | Admitting: Urology

## 2020-09-14 ENCOUNTER — Other Ambulatory Visit: Payer: Self-pay | Admitting: Family Medicine

## 2020-09-14 DIAGNOSIS — E1122 Type 2 diabetes mellitus with diabetic chronic kidney disease: Secondary | ICD-10-CM

## 2020-09-14 MED ORDER — METFORMIN HCL 1000 MG PO TABS: 1000.0000 mg | ORAL_TABLET | Freq: Two times a day (BID) | ORAL | 0 refills | Status: DC

## 2020-09-14 NOTE — Telephone Encounter (Signed)
Medication Refill - Medication: metFORMIN (GLUCOPHAGE) 1000 MG tablet  Pt has appt for 8.26.22/ Pt states he is out of refills and will run out before appt / please advise   Has the patient contacted their pharmacy? Yes.   (Agent: If no, request that the patient contact the pharmacy for the refill.) (Agent: If yes, when and what did the pharmacy advise?) Out of refills   Preferred Pharmacy (with phone number or street name): [id number] [place], [place] - [address]  [address] [place], [place] [address]  Phone:  [phone]  Fax:  [phone]   Agent: Please be advised that RX refills may take up to 3 business days. We ask that you follow-up with your pharmacy.

## 2020-09-14 NOTE — Telephone Encounter (Signed)
  Notes to clinic:  Patient has schedule appt for 10/22/2020 Review for medication until appt    Requested Prescriptions  Pending Prescriptions Disp Refills   metFORMIN (GLUCOPHAGE) 1000 MG tablet 60 tablet 0    Sig: Take 1 tablet (1,000 mg total) by mouth 2 (two) times daily. Please schedule an office visit before anymore refills.      Endocrinology:  Diabetes - Biguanides Failed - 09/14/2020  8:58 AM      Failed - HBA1C is between 0 and 7.9 and within 180 days    Hgb A1c MFr Bld  Date Value Ref Range Status  12/30/2019 7.0 (H) 4.8 - 5.6 % Final    Comment:             Prediabetes: 5.7 - 6.4          Diabetes: >6.4          Glycemic control for adults with diabetes: <7.0           Failed - Valid encounter within last 6 months    Recent Outpatient Visits           8 months ago Type 2 diabetes mellitus without complication, without long-term current use of insulin ([place])   [facility] Birdie Sons, MD   1 year ago Type 2 diabetes mellitus with chronic kidney disease, without long-term current use of insulin, unspecified CKD stage [facility])   [place], Donald E, MD   1 year ago Type 2 diabetes mellitus with chronic kidney disease, without long-term current use of insulin, unspecified CKD stage [facility])   [place], Donald E, MD   2 years ago Type 2 diabetes mellitus without complication, without long-term current use of insulin ([place])   [facility] Birdie Sons, MD   2 years ago Type 2 diabetes mellitus with chronic kidney disease, without long-term current use of insulin, unspecified CKD stage [facility])   [place], Donald E, MD       Future Appointments             In 1 month [place], Kirstie Peri, MD [facility]             Passed - Cr in normal range and within 360 days    Creatinine, Ser  Date Value Ref Range Status  12/30/2019 0.90 0.76 - 1.27 mg/dL  Final          Passed - eGFR in normal range and within 360 days    GFR calc Af Amer  Date Value Ref Range Status  12/30/2019 129 >59 mL/min/1.73 Final    Comment:    **In accordance with recommendations from the NKF-ASN Task force,**   [facility] is in the process of updating its eGFR calculation to the   2021 CKD-EPI creatinine equation that estimates kidney function   without a race variable.    GFR calc non Af Amer  Date Value Ref Range Status  12/30/2019 112 >59 mL/min/1.73 Final

## 2020-10-15 ENCOUNTER — Other Ambulatory Visit: Payer: Self-pay | Admitting: Family Medicine

## 2020-10-15 DIAGNOSIS — E1122 Type 2 diabetes mellitus with diabetic chronic kidney disease: Secondary | ICD-10-CM

## 2020-10-15 NOTE — Telephone Encounter (Signed)
Requested medications are due for refill today yes  Requested medications are on the active medication list yes  Last refill 7/20  Last visit 12/2019  Future visit scheduled 10/22/20  Notes to clinic Failed protocol due to no valid visit within 6  months, however, does have upcoming appt.

## 2020-10-22 ENCOUNTER — Ambulatory Visit: Payer: 59 | Admitting: Family Medicine

## 2020-10-22 ENCOUNTER — Encounter: Payer: Self-pay | Admitting: Family Medicine

## 2020-10-22 ENCOUNTER — Other Ambulatory Visit: Payer: Self-pay

## 2020-10-22 VITALS — BP 139/90 | HR 82 | Temp 98.3°F | Ht 72.0 in | Wt 226.4 lb

## 2020-10-22 DIAGNOSIS — Z720 Tobacco use: Secondary | ICD-10-CM | POA: Diagnosis not present

## 2020-10-22 DIAGNOSIS — E1169 Type 2 diabetes mellitus with other specified complication: Secondary | ICD-10-CM | POA: Insufficient documentation

## 2020-10-22 DIAGNOSIS — R030 Elevated blood-pressure reading, without diagnosis of hypertension: Secondary | ICD-10-CM

## 2020-10-22 DIAGNOSIS — E119 Type 2 diabetes mellitus without complications: Secondary | ICD-10-CM | POA: Diagnosis not present

## 2020-10-22 DIAGNOSIS — E669 Obesity, unspecified: Secondary | ICD-10-CM

## 2020-10-22 NOTE — Assessment & Plan Note (Signed)
DBP elevated today Family hx of HTN

## 2020-10-22 NOTE — Assessment & Plan Note (Signed)
Has reduced use and frequency Recommend stopping Pt not ready to stop at this time

## 2020-10-22 NOTE — Assessment & Plan Note (Signed)
Using metformin without complications Does not check BG Recommend smaller meal size, balanced snacks and increasing water/exercise

## 2020-10-22 NOTE — Progress Notes (Signed)
Established patient visit   Patient: Francisco Andrews   DOB: 07-13-1985   35 y.o. Male  MRN: [id number] Visit Date: 10/22/2020  Today's healthcare provider: Jacky Kindle, FNP   Chief Complaint  Patient presents with   Follow-up   Diabetes   Subjective  -------------------------------------------------------------------------------------------------------------------- HPI  Diabetes Mellitus Type II, Follow-up  Lab Results  Component Value Date   HGBA1C 7.0 (H) 12/30/2019   HGBA1C 7.4 (H) 06/17/2019   HGBA1C 7.9 (A) 02/14/2019   Wt Readings from Last 3 Encounters:  10/22/20 226 lb 6.4 oz (102.7 kg)  12/30/19 225 lb 12.8 oz (102.4 kg)  08/22/19 225 lb (102.1 kg)   Last seen for diabetes 9 months ago.  Management since then includes continue medication and start checking sugar at home more often. He reports good compliance with treatment. He is not having side effects.  Symptoms: No fatigue No foot ulcerations  No appetite changes No nausea  No paresthesia of the feet  No polydipsia  No polyuria No visual disturbances   No vomiting     Home blood sugar records:  none  Episodes of hypoglycemia? No    Current insulin regiment: none Most Recent Eye Exam: not sure  Current exercise: weightlifting, and cardio Current diet habits: well balanced  Pertinent Labs: Lab Results  Component Value Date   CHOL 230 (H) 10/05/2017   HDL 39 (L) 10/05/2017   LDLCALC 129 (H) 10/05/2017   LDLDIRECT 140 (H) 12/30/2019   TRIG 310 (H) 10/05/2017   CHOLHDL 5.9 (H) 10/05/2017   Lab Results  Component Value Date   NA 137 12/30/2019   K 4.4 12/30/2019   CREATININE 0.90 12/30/2019   GFRNONAA 112 12/30/2019   GFRAA 129 12/30/2019   GLUCOSE 109 (H) 12/30/2019     ---------------------------------------------------------------------------------------------------   Patient Active Problem List   Diagnosis Date Noted   Type 2 diabetes mellitus without complication,  without long-term current use of insulin (HCC) 10/22/2020   White coat syndrome with high blood pressure without hypertension 10/22/2020   Type 2 diabetes mellitus with obesity (HCC) 10/22/2020   Tobacco chew use 10/22/2020   Past Medical History:  Diagnosis Date   Allergy    Diabetes mellitus without complication (HCC)    Allergies  Allergen Reactions   Biaxin [Clarithromycin] Hives and Itching   Penicillins Hives    Has patient had a PCN reaction causing immediate rash, facial/tongue/throat swelling, SOB or lightheadedness with hypotension: /No/ Has patient had a PCN reaction causing severe rash involving mucus membranes or skin necrosis: /No/ Has patient had a PCN reaction that required hospitalization: No/ Has patient had a PCN reaction occurring within the last 10 years: No/ If all of the above answers are "NO", then may proceed with Cephalosporin use.    Sulfa Antibiotics Hives, Itching and Swelling    Swelling in throat       Medications: Outpatient Medications Prior to Visit  Medication Sig   metFORMIN (GLUCOPHAGE) 1000 MG tablet TAKE 1 TABLET BY MOUTH 2 (TWO) TIMES DAILY. PLEASE SCHEDULE AN OFFICE VISIT BEFORE ANYMORE REFILLS.   [DISCONTINUED] tadalafil (CIALIS) 5 MG tablet Take 1 tablet (5 mg total) by mouth daily. (Patient not taking: Reported on 10/22/2020)   No facility-administered medications prior to visit.    Review of Systems  Last CBC Lab Results  Component Value Date   WBC 8.4 12/30/2019   HGB 15.4 12/30/2019   HCT 46.5 12/30/2019   MCV 88 12/30/2019  MCH 29.3 12/30/2019   RDW 12.0 12/30/2019   PLT 334 12/30/2019   Last metabolic panel Lab Results  Component Value Date   GLUCOSE 109 (H) 12/30/2019   NA 137 12/30/2019   K 4.4 12/30/2019   CL 96 12/30/2019   CO2 26 12/30/2019   BUN 12 12/30/2019   CREATININE 0.90 12/30/2019   GFRNONAA 112 12/30/2019   GFRAA 129 12/30/2019   CALCIUM 9.7 12/30/2019   PROT 7.4 12/30/2019   ALBUMIN 4.8  12/30/2019   LABGLOB 2.6 12/30/2019   AGRATIO 1.8 12/30/2019   BILITOT 0.6 12/30/2019   ALKPHOS 82 12/30/2019   AST 22 12/30/2019   ALT 33 12/30/2019   ANIONGAP 11 09/24/2017   Last lipids Lab Results  Component Value Date   CHOL 230 (H) 10/05/2017   HDL 39 (L) 10/05/2017   LDLCALC 129 (H) 10/05/2017   LDLDIRECT 140 (H) 12/30/2019   TRIG 310 (H) 10/05/2017   CHOLHDL 5.9 (H) 10/05/2017   Last hemoglobin A1c Lab Results  Component Value Date   HGBA1C 7.0 (H) 12/30/2019   Last thyroid functions Lab Results  Component Value Date   TSH 3.320 12/30/2019     Objective  -------------------------------------------------------------------------------------------------------------------- BP 139/90 (BP Location: Right Arm, Patient Position: Sitting, Cuff Size: Large)   Pulse 82   Temp 98.3 F (36.8 C) (Oral)   Ht 6' (1.829 m)   Wt 226 lb 6.4 oz (102.7 kg)   SpO2 98%   BMI 30.71 kg/m  BP Readings from Last 3 Encounters:  10/22/20 139/90  12/30/19 127/86  08/22/19 (!) 143/93   Wt Readings from Last 3 Encounters:  10/22/20 226 lb 6.4 oz (102.7 kg)  12/30/19 225 lb 12.8 oz (102.4 kg)  08/22/19 225 lb (102.1 kg)       Physical Exam Vitals and nursing note reviewed.  Constitutional:      General: He is not in acute distress.    Appearance: Normal appearance. He is obese. He is not ill-appearing, toxic-appearing or diaphoretic.  HENT:     Head: Normocephalic and atraumatic.  Cardiovascular:     Rate and Rhythm: Normal rate and regular rhythm.     Pulses: Normal pulses.     Heart sounds: Normal heart sounds. No murmur heard.   No friction rub. No gallop.  Pulmonary:     Effort: Pulmonary effort is normal. No respiratory distress.     Breath sounds: Normal breath sounds. No stridor. No wheezing, rhonchi or rales.  Chest:     Chest wall: No tenderness.  Musculoskeletal:        General: No swelling or tenderness. Normal range of motion.     Cervical back: Normal range  of motion.     Right lower leg: No edema.     Left lower leg: No edema.  Skin:    General: Skin is warm and dry.     Capillary Refill: Capillary refill takes less than 2 seconds.  Neurological:     General: No focal deficit present.     Mental Status: He is alert and oriented to person, place, and time.  Psychiatric:        Mood and Affect: Mood normal.        Behavior: Behavior normal.        Thought Content: Thought content normal.        Judgment: Judgment normal.      No results found for any visits on 10/22/20.  Assessment & Plan  ---------------------------------------------------------------------------------------------------------------------- Problem List  Items Addressed This Visit       Cardiovascular and Mediastinum   White coat syndrome with high blood pressure without hypertension    DBP elevated today Family hx of HTN        Endocrine   Type 2 diabetes mellitus without complication, without long-term current use of insulin (HCC) - Primary    Using metformin without complications Does not check BG Recommend smaller meal size, balanced snacks and increasing water/exercise      Relevant Orders   POCT HgB A1C   CBC with Differential/Platelet   Comprehensive metabolic panel   Hemoglobin A1c   Lipid panel   Magnesium   TSH   Vitamin B12   VITAMIN D 25 Hydroxy (Vit-D Deficiency, Fractures)   Urinalysis, Routine w reflex microscopic   Type 2 diabetes mellitus with obesity (HCC)    BMI near 31 Pt working out 45-1 hr daily, variety of workouts [employer]graph[employer] at [employer]        Other   Tobacco chew use    Has reduced use and frequency Recommend stopping Pt not ready to stop at this time        Return in about 6 months (around 04/24/2021) for T2DM management.      Leilani Merl, FNP, have reviewed all documentation for this visit. The documentation on 10/22/20 for the exam, diagnosis, procedures, and orders are all accurate and  complete.    Jacky Kindle, FNP  [facility] [phone] (phone) [phone] (fax)  [facility]

## 2020-10-22 NOTE — Assessment & Plan Note (Addendum)
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

## 2020-10-23 LAB — URINALYSIS, ROUTINE W REFLEX MICROSCOPIC
Bilirubin, UA: NEGATIVE
Glucose, UA: NEGATIVE
Leukocytes,UA: NEGATIVE
Nitrite, UA: NEGATIVE
Protein,UA: NEGATIVE
RBC, UA: NEGATIVE
Specific Gravity, UA: 1.026 (ref 1.005–1.030)
Urobilinogen, Ur: 0.2 mg/dL (ref 0.2–1.0)
pH, UA: 6 (ref 5.0–7.5)

## 2020-10-23 LAB — LIPID PANEL
Chol/HDL Ratio: 6.6 ratio — ABNORMAL HIGH (ref 0.0–5.0)
Cholesterol, Total: 244 mg/dL — ABNORMAL HIGH (ref 100–199)
HDL: 37 mg/dL — ABNORMAL LOW (ref >39)
LDL Chol Calc (NIH): 161 mg/dL — ABNORMAL HIGH (ref 0–99)
Triglycerides: 244 mg/dL — ABNORMAL HIGH (ref 0–149)
VLDL Cholesterol Cal: 46 mg/dL — ABNORMAL HIGH (ref 5–40)

## 2020-10-23 LAB — COMPREHENSIVE METABOLIC PANEL
ALT: 35 IU/L (ref 0–44)
AST: 23 IU/L (ref 0–40)
Albumin/Globulin Ratio: 2.4 — ABNORMAL HIGH (ref 1.2–2.2)
Albumin: 5.5 g/dL — ABNORMAL HIGH (ref 4.0–5.0)
Alkaline Phosphatase: 87 IU/L (ref 44–121)
BUN/Creatinine Ratio: 18 (ref 9–20)
BUN: 18 mg/dL (ref 6–20)
Bilirubin Total: 0.4 mg/dL (ref 0.0–1.2)
CO2: 25 mmol/L (ref 20–29)
Calcium: 10.3 mg/dL — ABNORMAL HIGH (ref 8.7–10.2)
Chloride: 95 mmol/L — ABNORMAL LOW (ref 96–106)
Creatinine, Ser: 0.98 mg/dL (ref 0.76–1.27)
Globulin, Total: 2.3 g/dL (ref 1.5–4.5)
Glucose: 141 mg/dL — ABNORMAL HIGH (ref 65–99)
Potassium: 4.8 mmol/L (ref 3.5–5.2)
Sodium: 137 mmol/L (ref 134–144)
Total Protein: 7.8 g/dL (ref 6.0–8.5)
eGFR: 104 mL/min/{1.73_m2} (ref >59)

## 2020-10-23 LAB — CBC WITH DIFFERENTIAL/PLATELET
Basophils Absolute: 0.1 10*3/uL (ref 0.0–0.2)
Basos: 1 %
EOS (ABSOLUTE): 0.1 10*3/uL (ref 0.0–0.4)
Eos: 2 %
Hematocrit: 49.8 % (ref 37.5–51.0)
Hemoglobin: 16.4 g/dL (ref 13.0–17.7)
Immature Grans (Abs): 0 10*3/uL (ref 0.0–0.1)
Immature Granulocytes: 0 %
Lymphocytes Absolute: 2.4 10*3/uL (ref 0.7–3.1)
Lymphs: 33 %
MCH: 29 pg (ref 26.6–33.0)
MCHC: 32.9 g/dL (ref 31.5–35.7)
MCV: 88 fL (ref 79–97)
Monocytes Absolute: 0.4 10*3/uL (ref 0.1–0.9)
Monocytes: 6 %
Neutrophils Absolute: 4.3 10*3/uL (ref 1.4–7.0)
Neutrophils: 58 %
Platelets: 343 10*3/uL (ref 150–450)
RBC: 5.65 x10E6/uL (ref 4.14–5.80)
RDW: 12.2 % (ref 11.6–15.4)
WBC: 7.3 10*3/uL (ref 3.4–10.8)

## 2020-10-23 LAB — MAGNESIUM: Magnesium: 1.8 mg/dL (ref 1.6–2.3)

## 2020-10-23 LAB — VITAMIN D 25 HYDROXY (VIT D DEFICIENCY, FRACTURES): Vit D, 25-Hydroxy: 23.3 ng/mL — ABNORMAL LOW (ref 30.0–100.0)

## 2020-10-23 LAB — TSH: TSH: 2.34 u[IU]/mL (ref 0.450–4.500)

## 2020-10-23 LAB — VITAMIN B12: Vitamin B-12: 375 pg/mL (ref 232–1245)

## 2020-10-23 LAB — HEMOGLOBIN A1C
Est. average glucose Bld gHb Est-mCnc: 148 mg/dL
Hgb A1c MFr Bld: 6.8 % — ABNORMAL HIGH (ref 4.8–5.6)

## 2020-11-12 ENCOUNTER — Other Ambulatory Visit: Payer: Self-pay | Admitting: Family Medicine

## 2020-11-12 DIAGNOSIS — E1122 Type 2 diabetes mellitus with diabetic chronic kidney disease: Secondary | ICD-10-CM

## 2020-12-06 NOTE — Telephone Encounter (Signed)
Opened in error

## 2021-04-14 ENCOUNTER — Other Ambulatory Visit: Payer: Self-pay | Admitting: Family Medicine

## 2021-04-14 DIAGNOSIS — E1122 Type 2 diabetes mellitus with diabetic chronic kidney disease: Secondary | ICD-10-CM

## 2021-04-14 NOTE — Telephone Encounter (Signed)
Requested Prescriptions  Pending Prescriptions Disp Refills   metFORMIN (GLUCOPHAGE) 1000 MG tablet [Pharmacy Med Name: METFORMIN HCL 1,000 MG TABLET] 60 tablet 4    Sig: TAKE 1 TABLET (1,000 MG TOTAL) BY MOUTH 2 (TWO) TIMES DAILY WITH A MEAL.     Endocrinology:  Diabetes - Biguanides Passed - 04/14/2021  1:45 AM      Passed - Cr in normal range and within 360 days    Creatinine, Ser  Date Value Ref Range Status  10/22/2020 0.98 0.76 - 1.27 mg/dL Final         Passed - HBA1C is between 0 and 7.9 and within 180 days    Hgb A1c MFr Bld  Date Value Ref Range Status  10/22/2020 6.8 (H) 4.8 - 5.6 % Final    Comment:             Prediabetes: 5.7 - 6.4          Diabetes: >6.4          Glycemic control for adults with diabetes: <7.0          Passed - eGFR in normal range and within 360 days    GFR calc Af Amer  Date Value Ref Range Status  12/30/2019 129 >59 mL/min/1.73 Final    Comment:    **In accordance with recommendations from the NKF-ASN Task force,**   [facility] is in the process of updating its eGFR calculation to the   2021 CKD-EPI creatinine equation that estimates kidney function   without a race variable.    GFR calc non Af Amer  Date Value Ref Range Status  12/30/2019 112 >59 mL/min/1.73 Final   eGFR  Date Value Ref Range Status  10/22/2020 104 >59 mL/min/1.73 Final         Passed - B12 Level in normal range and within 720 days    Vitamin B-12  Date Value Ref Range Status  10/22/2020 375 232 - 1,245 pg/mL Final         Passed - Valid encounter within last 6 months    Recent Outpatient Visits          5 months ago Type 2 diabetes mellitus without complication, without long-term current use of insulin [facility])   [facility] Tally Joe T, FNP   1 year ago Type 2 diabetes mellitus without complication, without long-term current use of insulin ([place])   [facility] Birdie Sons, MD   1 year ago Type 2 diabetes mellitus with chronic  kidney disease, without long-term current use of insulin, unspecified CKD stage [facility])   [facility] Birdie Sons, MD   2 years ago Type 2 diabetes mellitus with chronic kidney disease, without long-term current use of insulin, unspecified CKD stage [facility])   [facility] Birdie Sons, MD   2 years ago Type 2 diabetes mellitus without complication, without long-term current use of insulin [facility])   [facility] Birdie Sons, MD             Passed - CBC within normal limits and completed in the last 12 months    WBC  Date Value Ref Range Status  10/22/2020 7.3 3.4 - 10.8 x10E3/uL Final  09/24/2017 7.3 4.0 - 10.5 K/uL Final   RBC  Date Value Ref Range Status  10/22/2020 5.65 4.14 - 5.80 x10E6/uL Final  09/24/2017 5.75 4.22 - 5.81 MIL/uL Final   Hemoglobin  Date Value Ref Range Status  10/22/2020 16.4 13.0 - 17.7 g/dL Final   Total hemoglobin  Date Value Ref Range Status  07/21/2013 15.9 13.5 - 18.0 g/dL Final   Hematocrit  Date Value Ref Range Status  10/22/2020 49.8 37.5 - 51.0 % Final   MCHC  Date Value Ref Range Status  10/22/2020 32.9 31.5 - 35.7 g/dL Final  09/24/2017 36.2 (H) 30.0 - 36.0 g/dL Final   [facility]  Date Value Ref Range Status  10/22/2020 29.0 26.6 - 33.0 pg Final  09/24/2017 31.0 26.0 - 34.0 pg Final   MCV  Date Value Ref Range Status  10/22/2020 88 79 - 97 fL Final   No results found for: PLTCOUNTKUC, LABPLAT, POCPLA RDW  Date Value Ref Range Status  10/22/2020 12.2 11.6 - 15.4 % Final

## 2021-09-14 ENCOUNTER — Other Ambulatory Visit: Payer: Self-pay | Admitting: Family Medicine

## 2021-09-14 DIAGNOSIS — E1122 Type 2 diabetes mellitus with diabetic chronic kidney disease: Secondary | ICD-10-CM

## 2021-09-14 NOTE — Telephone Encounter (Signed)
Medication Refill - Medication: metFORMIN (GLUCOPHAGE) 1000 MG tablet  Has the patient contacted their pharmacy? No.   Preferred Pharmacy (with phone number or street name):  [id number] [place], [place] [address] Phone:  [phone]  Fax:  [phone]     Has the patient been seen for an appointment in the last year OR does the patient have an upcoming appointment? Yes.

## 2021-09-15 MED ORDER — METFORMIN HCL 1000 MG PO TABS: 1000.0000 mg | ORAL_TABLET | Freq: Two times a day (BID) | ORAL | 1 refills | Status: DC

## 2021-09-15 NOTE — Telephone Encounter (Signed)
Requested Prescriptions  Pending Prescriptions Disp Refills  . metFORMIN (GLUCOPHAGE) 1000 MG tablet 60 tablet 1    Sig: Take 1 tablet (1,000 mg total) by mouth 2 (two) times daily with a meal.     Endocrinology:  Diabetes - Biguanides Failed - 09/14/2021 11:21 AM      Failed - HBA1C is between 0 and 7.9 and within 180 days    Hgb A1c MFr Bld  Date Value Ref Range Status  10/22/2020 6.8 (H) 4.8 - 5.6 % Final    Comment:             Prediabetes: 5.7 - 6.4          Diabetes: >6.4          Glycemic control for adults with diabetes: <7.0          Failed - Valid encounter within last 6 months    Recent Outpatient Visits          10 months ago Type 2 diabetes mellitus without complication, without long-term current use of insulin [facility])   [facility] Tally Joe T, FNP   1 year ago Type 2 diabetes mellitus without complication, without long-term current use of insulin ([place])   [facility] Birdie Sons, MD   2 years ago Type 2 diabetes mellitus with chronic kidney disease, without long-term current use of insulin, unspecified CKD stage [facility])   [place], Donald E, MD   2 years ago Type 2 diabetes mellitus with chronic kidney disease, without long-term current use of insulin, unspecified CKD stage [facility])   [place], Donald E, MD   3 years ago Type 2 diabetes mellitus without complication, without long-term current use of insulin [facility])   [facility] Birdie Sons, MD      Future Appointments            In 3 weeks Fisher, Kirstie Peri, MD [facility]           Passed - Cr in normal range and within 360 days    Creatinine, Ser  Date Value Ref Range Status  10/22/2020 0.98 0.76 - 1.27 mg/dL Final         Passed - eGFR in normal range and within 360 days    GFR calc Af Amer  Date Value Ref Range Status  12/30/2019 129 >59 mL/min/1.73 Final    Comment:    **In  accordance with recommendations from the NKF-ASN Task force,**   [facility] is in the process of updating its eGFR calculation to the   2021 CKD-EPI creatinine equation that estimates kidney function   without a race variable.    GFR calc non Af Amer  Date Value Ref Range Status  12/30/2019 112 >59 mL/min/1.73 Final   eGFR  Date Value Ref Range Status  10/22/2020 104 >59 mL/min/1.73 Final         Passed - B12 Level in normal range and within 720 days    Vitamin B-12  Date Value Ref Range Status  10/22/2020 375 232 - 1,245 pg/mL Final         Passed - CBC within normal limits and completed in the last 12 months    WBC  Date Value Ref Range Status  10/22/2020 7.3 3.4 - 10.8 x10E3/uL Final  09/24/2017 7.3 4.0 - 10.5 K/uL Final   RBC  Date Value Ref Range Status  10/22/2020 5.65 4.14 - 5.80 x10E6/uL Final  09/24/2017 5.75 4.22 - 5.81 MIL/uL Final   Hemoglobin  Date Value Ref Range Status  10/22/2020 16.4 13.0 - 17.7 g/dL Final   Total hemoglobin  Date Value Ref Range Status  07/21/2013 15.9 13.5 - 18.0 g/dL Final   Hematocrit  Date Value Ref Range Status  10/22/2020 49.8 37.5 - 51.0 % Final   MCHC  Date Value Ref Range Status  10/22/2020 32.9 31.5 - 35.7 g/dL Final  09/24/2017 36.2 (H) 30.0 - 36.0 g/dL Final   [facility]  Date Value Ref Range Status  10/22/2020 29.0 26.6 - 33.0 pg Final  09/24/2017 31.0 26.0 - 34.0 pg Final   MCV  Date Value Ref Range Status  10/22/2020 88 79 - 97 fL Final   No results found for: "PLTCOUNTKUC", "LABPLAT", "POCPLA" RDW  Date Value Ref Range Status  10/22/2020 12.2 11.6 - 15.4 % Final

## 2021-09-17 ENCOUNTER — Telehealth: Payer: Self-pay | Admitting: Family Medicine

## 2021-09-17 DIAGNOSIS — E1122 Type 2 diabetes mellitus with diabetic chronic kidney disease: Secondary | ICD-10-CM

## 2021-09-19 NOTE — Telephone Encounter (Signed)
Attempted to call patient- Rx has been sent in -but this request is from another pharmacy- no answer and no VM.  Rx 09/15/21 #60 1RF Requested Prescriptions  Pending Prescriptions Disp Refills  . metFORMIN (GLUCOPHAGE) 1000 MG tablet [Pharmacy Med Name: METFORMIN HCL 1,000 MG TABLET] 60 tablet 1    Sig: TAKE 1 TABLET (1,000 MG TOTAL) BY MOUTH TWICE A DAY WITH FOOD     Endocrinology:  Diabetes - Biguanides Failed - 09/17/2021  1:08 AM      Failed - HBA1C is between 0 and 7.9 and within 180 days    Hgb A1c MFr Bld  Date Value Ref Range Status  10/22/2020 6.8 (H) 4.8 - 5.6 % Final    Comment:             Prediabetes: 5.7 - 6.4          Diabetes: >6.4          Glycemic control for adults with diabetes: <7.0          Failed - Valid encounter within last 6 months    Recent Outpatient Visits          11 months ago Type 2 diabetes mellitus without complication, without long-term current use of insulin [facility])   [facility] Tally Joe T, FNP   1 year ago Type 2 diabetes mellitus without complication, without long-term current use of insulin ([place])   [facility] Birdie Sons, MD   2 years ago Type 2 diabetes mellitus with chronic kidney disease, without long-term current use of insulin, unspecified CKD stage [facility])   [place], Donald E, MD   2 years ago Type 2 diabetes mellitus with chronic kidney disease, without long-term current use of insulin, unspecified CKD stage [facility])   [place], Donald E, MD   3 years ago Type 2 diabetes mellitus without complication, without long-term current use of insulin [facility])   [facility] Birdie Sons, MD      Future Appointments            In 2 weeks Fisher, Kirstie Peri, MD [facility]           Passed - Cr in normal range and within 360 days    Creatinine, Ser  Date Value Ref Range Status  10/22/2020 0.98 0.76 - 1.27 mg/dL Final          Passed - eGFR in normal range and within 360 days    GFR calc Af Amer  Date Value Ref Range Status  12/30/2019 129 >59 mL/min/1.73 Final    Comment:    **In accordance with recommendations from the NKF-ASN Task force,**   [facility] is in the process of updating its eGFR calculation to the   2021 CKD-EPI creatinine equation that estimates kidney function   without a race variable.    GFR calc non Af Amer  Date Value Ref Range Status  12/30/2019 112 >59 mL/min/1.73 Final   eGFR  Date Value Ref Range Status  10/22/2020 104 >59 mL/min/1.73 Final         Passed - B12 Level in normal range and within 720 days    Vitamin B-12  Date Value Ref Range Status  10/22/2020 375 232 - 1,245 pg/mL Final         Passed - CBC within normal limits and completed in the last 12 months    WBC  Date Value Ref Range Status  10/22/2020 7.3 3.4 - 10.8 x10E3/uL Final  09/24/2017 7.3 4.0 - 10.5 K/uL Final   RBC  Date Value Ref Range Status  10/22/2020 5.65 4.14 - 5.80 x10E6/uL Final  09/24/2017 5.75 4.22 - 5.81 MIL/uL Final   Hemoglobin  Date Value Ref Range Status  10/22/2020 16.4 13.0 - 17.7 g/dL Final   Total hemoglobin  Date Value Ref Range Status  07/21/2013 15.9 13.5 - 18.0 g/dL Final   Hematocrit  Date Value Ref Range Status  10/22/2020 49.8 37.5 - 51.0 % Final   MCHC  Date Value Ref Range Status  10/22/2020 32.9 31.5 - 35.7 g/dL Final  09/24/2017 36.2 (H) 30.0 - 36.0 g/dL Final   [facility]  Date Value Ref Range Status  10/22/2020 29.0 26.6 - 33.0 pg Final  09/24/2017 31.0 26.0 - 34.0 pg Final   MCV  Date Value Ref Range Status  10/22/2020 88 79 - 97 fL Final   No results found for: "PLTCOUNTKUC", "LABPLAT", "POCPLA" RDW  Date Value Ref Range Status  10/22/2020 12.2 11.6 - 15.4 % Final

## 2021-09-19 NOTE — Telephone Encounter (Signed)
Pt called about refill for Metformin being denied / advised pt Rx was sent on 7.20.23 to [facility] [address] / pt will check back with the pharmacy

## 2021-09-21 ENCOUNTER — Other Ambulatory Visit: Payer: Self-pay | Admitting: Family Medicine

## 2021-09-21 DIAGNOSIS — E1122 Type 2 diabetes mellitus with diabetic chronic kidney disease: Secondary | ICD-10-CM

## 2021-09-21 NOTE — Telephone Encounter (Signed)
Medication Refill - Medication: metFORMIN (GLUCOPHAGE) 1000 MG tablet  Has the patient contacted their pharmacy? Yes.     Preferred Pharmacy (with phone number or street name):  [facility] [phone] [place], [place] [address] Phone:  [phone]  Fax:  [phone]     Has the patient been seen for an appointment in the last year OR does the patient have an upcoming appointment? Yes.   [id number]

## 2021-09-22 NOTE — Telephone Encounter (Signed)
Requested medication (s) are due for refill today:no  Requested medication (s) are on the active medication list:yes  Last refill:  09/15/21  Future visit scheduled:yes  Notes to clinic:  Unable to refill per protocol, possible duplicate. Medication was refilled 09/15/21 for 60 and 1 refill.     Requested Prescriptions  Pending Prescriptions Disp Refills   metFORMIN (GLUCOPHAGE) 1000 MG tablet 60 tablet 1    Sig: Take 1 tablet (1,000 mg total) by mouth 2 (two) times daily with a meal.     Endocrinology:  Diabetes - Biguanides Failed - 09/21/2021 11:25 AM      Failed - HBA1C is between 0 and 7.9 and within 180 days    Hgb A1c MFr Bld  Date Value Ref Range Status  10/22/2020 6.8 (H) 4.8 - 5.6 % Final    Comment:             Prediabetes: 5.7 - 6.4          Diabetes: >6.4          Glycemic control for adults with diabetes: <7.0          Failed - Valid encounter within last 6 months    Recent Outpatient Visits           11 months ago Type 2 diabetes mellitus without complication, without long-term current use of insulin [facility])   [facility] Tally Joe T, FNP   1 year ago Type 2 diabetes mellitus without complication, without long-term current use of insulin ([place])   [facility] Birdie Sons, MD   2 years ago Type 2 diabetes mellitus with chronic kidney disease, without long-term current use of insulin, unspecified CKD stage [facility])   [place], Donald E, MD   2 years ago Type 2 diabetes mellitus with chronic kidney disease, without long-term current use of insulin, unspecified CKD stage [facility])   [place], Donald E, MD   3 years ago Type 2 diabetes mellitus without complication, without long-term current use of insulin [facility])   [facility] Birdie Sons, MD       Future Appointments             In 2 weeks Fisher, Kirstie Peri, MD [facility]            Passed  - Cr in normal range and within 360 days    Creatinine, Ser  Date Value Ref Range Status  10/22/2020 0.98 0.76 - 1.27 mg/dL Final         Passed - eGFR in normal range and within 360 days    GFR calc Af Amer  Date Value Ref Range Status  12/30/2019 129 >59 mL/min/1.73 Final    Comment:    **In accordance with recommendations from the NKF-ASN Task force,**   [facility] is in the process of updating its eGFR calculation to the   2021 CKD-EPI creatinine equation that estimates kidney function   without a race variable.    GFR calc non Af Amer  Date Value Ref Range Status  12/30/2019 112 >59 mL/min/1.73 Final   eGFR  Date Value Ref Range Status  10/22/2020 104 >59 mL/min/1.73 Final         Passed - B12 Level in normal range and within 720 days    Vitamin B-12  Date Value Ref Range Status  10/22/2020 375 232 - 1,245 pg/mL Final  Passed - CBC within normal limits and completed in the last 12 months    WBC  Date Value Ref Range Status  10/22/2020 7.3 3.4 - 10.8 x10E3/uL Final  09/24/2017 7.3 4.0 - 10.5 K/uL Final   RBC  Date Value Ref Range Status  10/22/2020 5.65 4.14 - 5.80 x10E6/uL Final  09/24/2017 5.75 4.22 - 5.81 MIL/uL Final   Hemoglobin  Date Value Ref Range Status  10/22/2020 16.4 13.0 - 17.7 g/dL Final   Total hemoglobin  Date Value Ref Range Status  07/21/2013 15.9 13.5 - 18.0 g/dL Final   Hematocrit  Date Value Ref Range Status  10/22/2020 49.8 37.5 - 51.0 % Final   MCHC  Date Value Ref Range Status  10/22/2020 32.9 31.5 - 35.7 g/dL Final  09/24/2017 36.2 (H) 30.0 - 36.0 g/dL Final   [facility]  Date Value Ref Range Status  10/22/2020 29.0 26.6 - 33.0 pg Final  09/24/2017 31.0 26.0 - 34.0 pg Final   MCV  Date Value Ref Range Status  10/22/2020 88 79 - 97 fL Final   No results found for: "PLTCOUNTKUC", "LABPLAT", "POCPLA" RDW  Date Value Ref Range Status  10/22/2020 12.2 11.6 - 15.4 % Final

## 2021-10-07 ENCOUNTER — Ambulatory Visit: Payer: 59 | Admitting: Family Medicine

## 2021-10-07 ENCOUNTER — Encounter: Payer: Self-pay | Admitting: Family Medicine

## 2021-10-07 VITALS — BP 138/92 | HR 96 | Temp 97.6°F | Resp 16 | Ht 72.0 in | Wt 228.0 lb

## 2021-10-07 DIAGNOSIS — R030 Elevated blood-pressure reading, without diagnosis of hypertension: Secondary | ICD-10-CM

## 2021-10-07 DIAGNOSIS — E119 Type 2 diabetes mellitus without complications: Secondary | ICD-10-CM | POA: Diagnosis not present

## 2021-10-07 NOTE — Progress Notes (Unsigned)
     I,Jana Robinson,acting as a scribe for Lelon Huh, MD.,have documented all relevant documentation on the behalf of Lelon Huh, MD,as directed by  Lelon Huh, MD while in the presence of Lelon Huh, MD.   Established patient visit   Patient: Francisco Andrews   DOB: [birth year]   36 y.o. Male  MRN: [id number] Visit Date: 10/07/2021  Today's healthcare provider: Lelon Huh, MD   Chief Complaint  Patient presents with   Diabetes   Subjective    HPI  Diabetes Mellitus Type II, Follow-up  Lab Results  Component Value Date   HGBA1C 6.8 (H) 10/22/2020   HGBA1C 7.0 (H) 12/30/2019   HGBA1C 7.4 (H) 06/17/2019   Wt Readings from Last 3 Encounters:  10/07/21 228 lb (103.4 kg)  10/22/20 226 lb 6.4 oz (102.7 kg)  12/30/19 225 lb 12.8 oz (102.4 kg)   Last seen for diabetes 1 years ago.  Management since then includes. Diet and exercise was recommended. He reports excellent compliance with treatment. He is not having side effects.    Home blood sugar records:  does not take   Episodes of hypoglycemia? No    Current insulin regiment:none  Most Recent Eye Exam: '22  Current exercise: bicycling, walking, and weightlifting Current diet habits: in general, a "healthy" diet    Pertinent Labs: Lab Results  Component Value Date   CHOL 244 (H) 10/22/2020   HDL 37 (L) 10/22/2020   LDLCALC 161 (H) 10/22/2020   LDLDIRECT 140 (H) 12/30/2019   TRIG 244 (H) 10/22/2020   CHOLHDL 6.6 (H) 10/22/2020   Lab Results  Component Value Date   NA 137 10/22/2020   K 4.8 10/22/2020   CREATININE 0.98 10/22/2020   EGFR 104 10/22/2020   MICROALBUR 20 08/23/2018   LABMICR Comment 10/22/2020     ---------------------------------------------------------------------------------------------------   Medications: Outpatient Medications Prior to Visit  Medication Sig   metFORMIN (GLUCOPHAGE) 1000 MG tablet Take 1 tablet (1,000 mg total) by mouth 2 (two) times daily with a meal.    No facility-administered medications prior to visit.    Review of Systems  Constitutional:  Negative for appetite change, chills and fever.  Respiratory:  Negative for chest tightness, shortness of breath and wheezing.   Cardiovascular:  Negative for chest pain and palpitations.  Gastrointestinal:  Negative for abdominal pain, nausea and vomiting.    {Labs  Heme  Chem  Endocrine  Serology  Results Review (optional):23779}   Objective    {Show previous vital signs (optional):23777} Vitals:   10/07/21 1550 10/07/21 1554  BP: (!) 151/81 (!) 144/80  Pulse: 96   Resp: 16   Temp: 97.6 F (36.4 C)   SpO2: 99%     Physical Exam  ***  No results found for any visits on 10/07/21.  Assessment & Plan     ***  No follow-ups on file.      {provider attestation***:1}   Lelon Huh, MD  [facility] [phone] (phone) [phone] (fax)  [place]

## 2021-10-08 LAB — COMPREHENSIVE METABOLIC PANEL
ALT: 76 IU/L — ABNORMAL HIGH (ref 0–44)
AST: 38 IU/L (ref 0–40)
Albumin/Globulin Ratio: 2 (ref 1.2–2.2)
Albumin: 5.1 g/dL (ref 4.1–5.1)
Alkaline Phosphatase: 90 IU/L (ref 44–121)
BUN/Creatinine Ratio: 14 (ref 9–20)
BUN: 16 mg/dL (ref 6–20)
Bilirubin Total: 0.6 mg/dL (ref 0.0–1.2)
CO2: 25 mmol/L (ref 20–29)
Calcium: 9.6 mg/dL (ref 8.7–10.2)
Chloride: 98 mmol/L (ref 96–106)
Creatinine, Ser: 1.17 mg/dL (ref 0.76–1.27)
Globulin, Total: 2.6 g/dL (ref 1.5–4.5)
Glucose: 117 mg/dL — ABNORMAL HIGH (ref 70–99)
Potassium: 4.2 mmol/L (ref 3.5–5.2)
Sodium: 141 mmol/L (ref 134–144)
Total Protein: 7.7 g/dL (ref 6.0–8.5)
eGFR: 84 mL/min/{1.73_m2} (ref >59)

## 2021-10-08 LAB — CBC
Hematocrit: 39.1 % (ref 37.5–51.0)
Hemoglobin: 13.1 g/dL (ref 13.0–17.7)
MCH: 28.6 pg (ref 26.6–33.0)
MCHC: 33.5 g/dL (ref 31.5–35.7)
MCV: 85 fL (ref 79–97)
Platelets: 304 10*3/uL (ref 150–450)
RBC: 4.58 x10E6/uL (ref 4.14–5.80)
RDW: 11.9 % (ref 11.6–15.4)
WBC: 7.5 10*3/uL (ref 3.4–10.8)

## 2021-10-08 LAB — LIPID PANEL
Chol/HDL Ratio: 5.7 ratio — ABNORMAL HIGH (ref 0.0–5.0)
Cholesterol, Total: 241 mg/dL — ABNORMAL HIGH (ref 100–199)
HDL: 42 mg/dL (ref >39)
LDL Chol Calc (NIH): 173 mg/dL — ABNORMAL HIGH (ref 0–99)
Triglycerides: 144 mg/dL (ref 0–149)
VLDL Cholesterol Cal: 26 mg/dL (ref 5–40)

## 2021-10-08 LAB — HEMOGLOBIN A1C
Est. average glucose Bld gHb Est-mCnc: 171 mg/dL
Hgb A1c MFr Bld: 7.6 % — ABNORMAL HIGH (ref 4.8–5.6)

## 2021-10-08 LAB — LDL CHOLESTEROL, DIRECT: LDL Direct: 173 mg/dL — ABNORMAL HIGH (ref 0–99)

## 2021-10-10 ENCOUNTER — Other Ambulatory Visit: Payer: Self-pay

## 2021-10-10 DIAGNOSIS — E1169 Type 2 diabetes mellitus with other specified complication: Secondary | ICD-10-CM

## 2021-10-10 MED ORDER — ROSUVASTATIN CALCIUM 5 MG PO TABS: 5.0000 mg | ORAL_TABLET | Freq: Every day | ORAL | 2 refills | Status: DC

## 2021-11-14 ENCOUNTER — Other Ambulatory Visit: Payer: Self-pay | Admitting: Family Medicine

## 2021-11-14 DIAGNOSIS — E1122 Type 2 diabetes mellitus with diabetic chronic kidney disease: Secondary | ICD-10-CM

## 2021-11-22 IMAGING — CT CT ABD-PEL WO/W CM
2 of 8 series · 12 of 46 positions shown, 18 images · IV contrast (omnipaque)
Comparison: None.

CLINICAL DATA: Gross hematuria

EXAM:
CT ABDOMEN AND PELVIS WITHOUT AND WITH CONTRAST
TECHNIQUE: Multidetector CT imaging of the abdomen and pelvis was performed
following the standard protocol before and following the bolus
administration of intravenous contrast.
CONTRAST:  125mL OMNIPAQUE IOHEXOL 300 MG/ML  SOLN

[Series 2: axial without without pre 5.00 · axial · non-contrast · 0.74mm/px · z∈[-1527,-1122]mm · 9 of 100 slices shown, 15 images]
[im 10/100  soft-tissue]
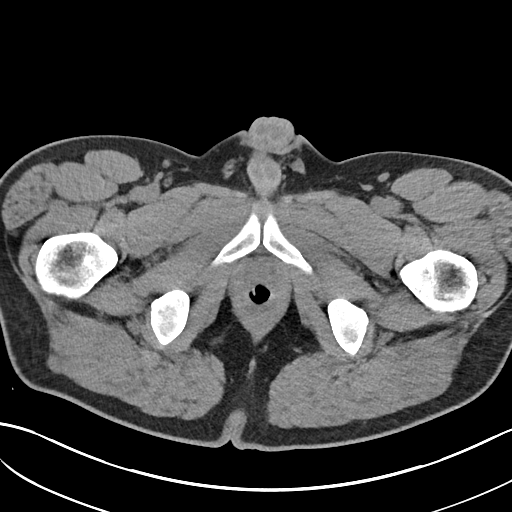
[im 10/100  bone]
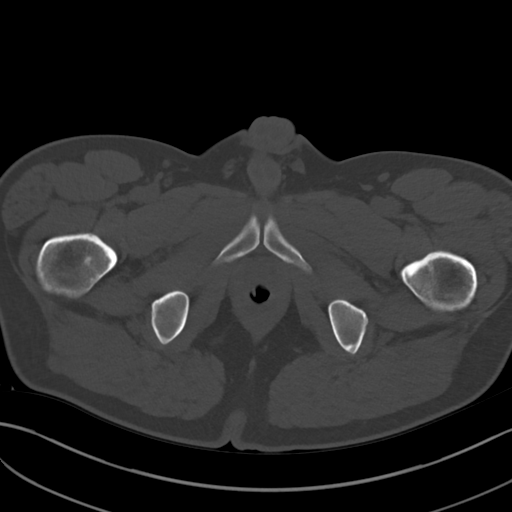
[im 19/100  soft-tissue]
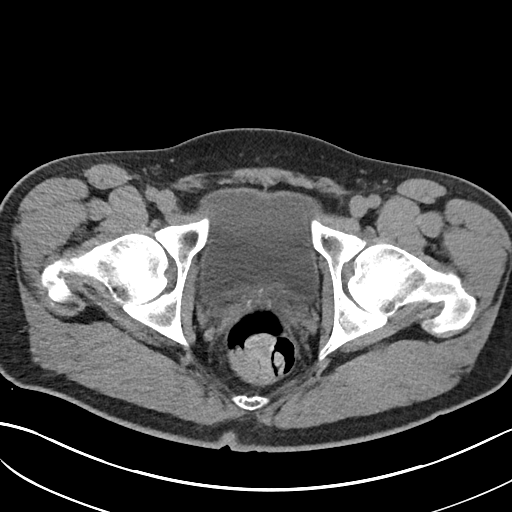
[im 28/100  soft-tissue]
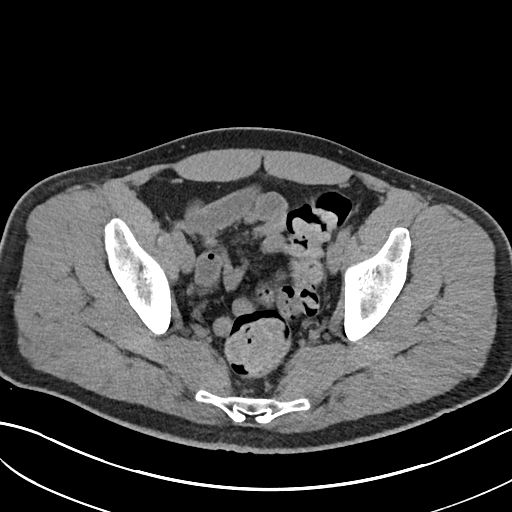
[im 37/100  soft-tissue]
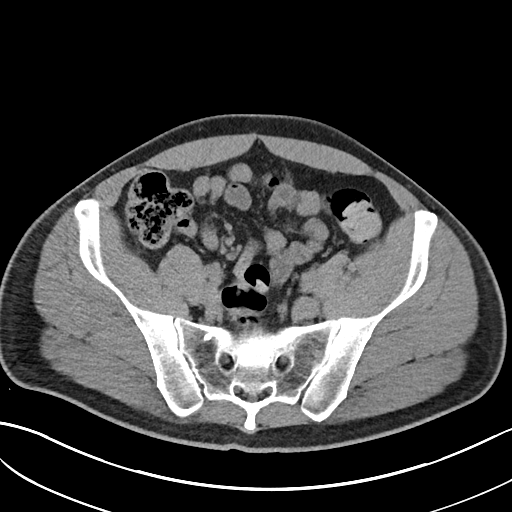
[im 55/100  soft-tissue]
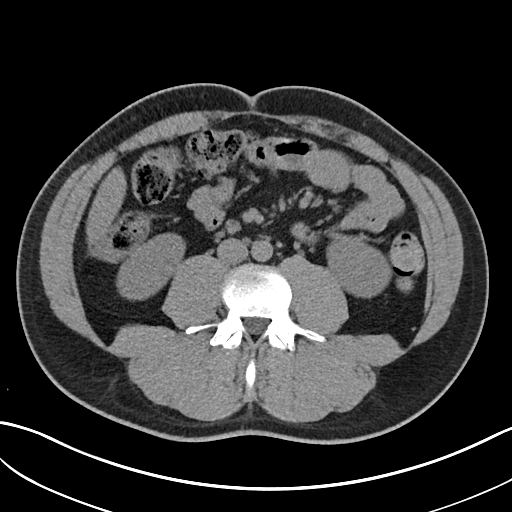
[im 64/100  soft-tissue]
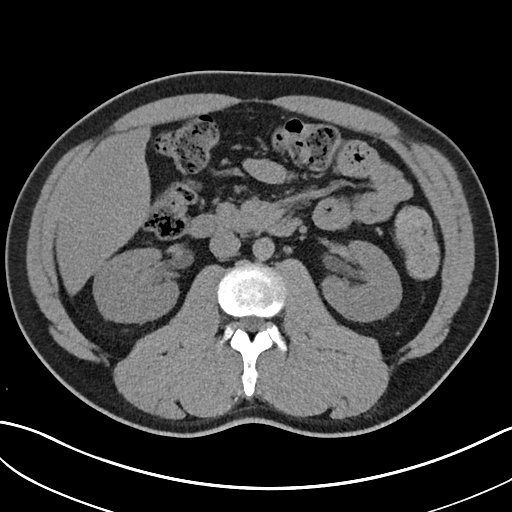
[im 64/100  lung]
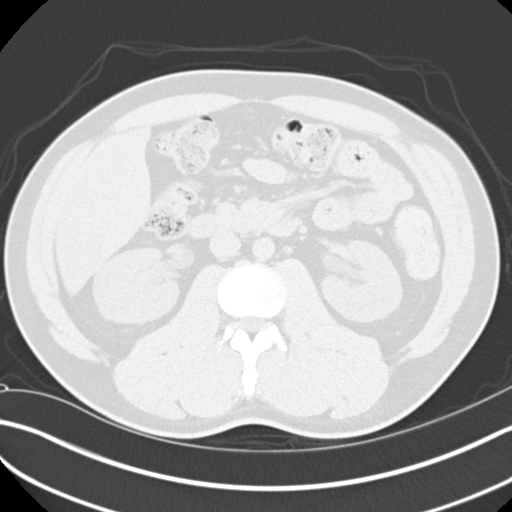
[im 73/100  soft-tissue]
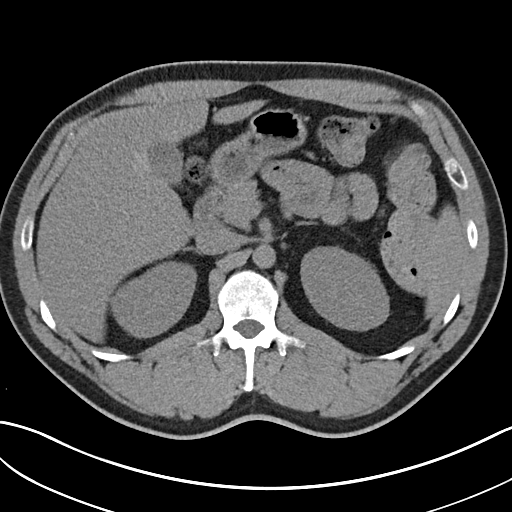
[im 73/100  lung]
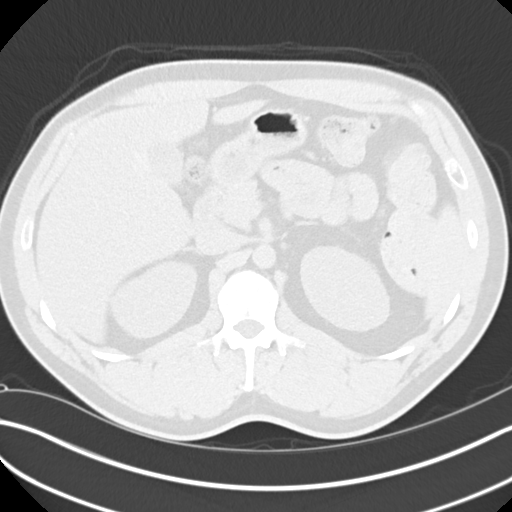
[im 82/100  soft-tissue]
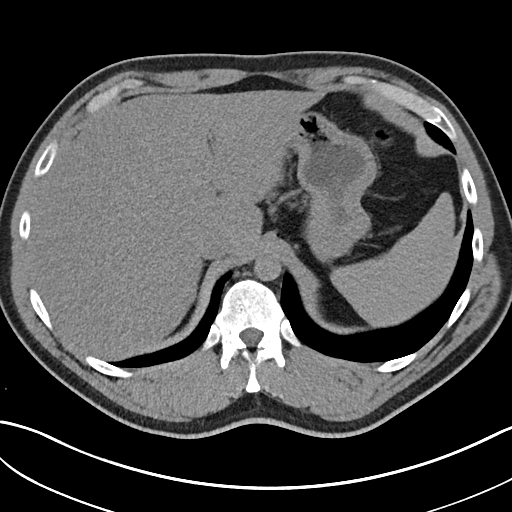
[im 82/100  lung]
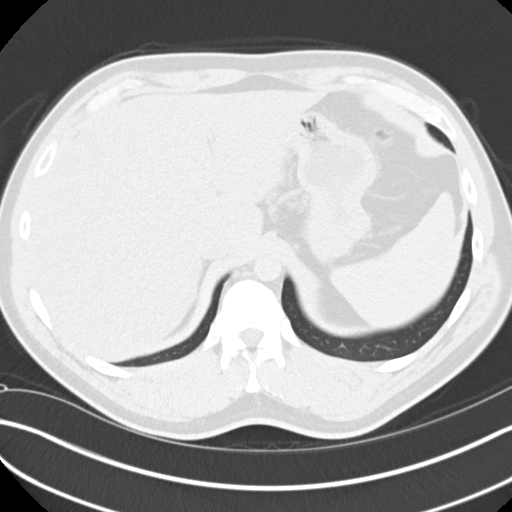
[im 91/100  soft-tissue]
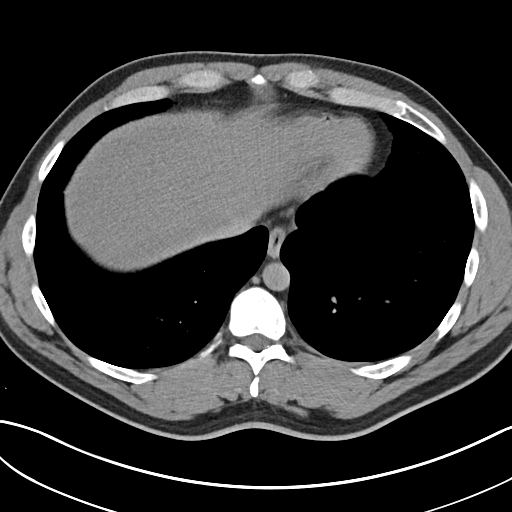
[im 91/100  lung]
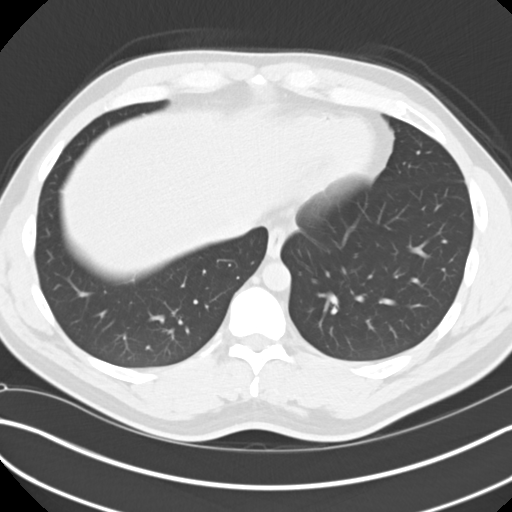
[im 91/100  bone]
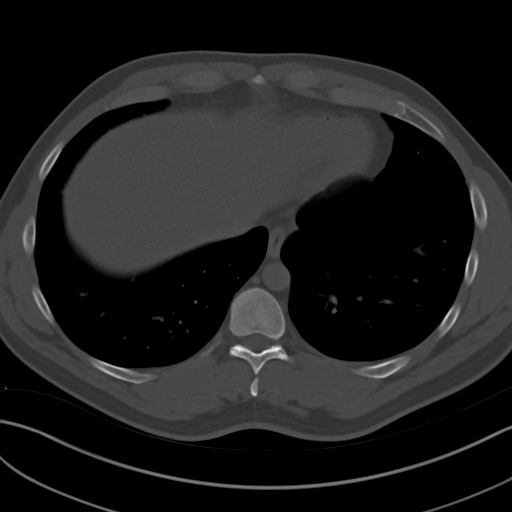

[Series 5: cor without without pre 2.00 cor · coronal · non-contrast · 0.73mm/px · 3 of 145 slices shown]
[im 37/145  soft-tissue]
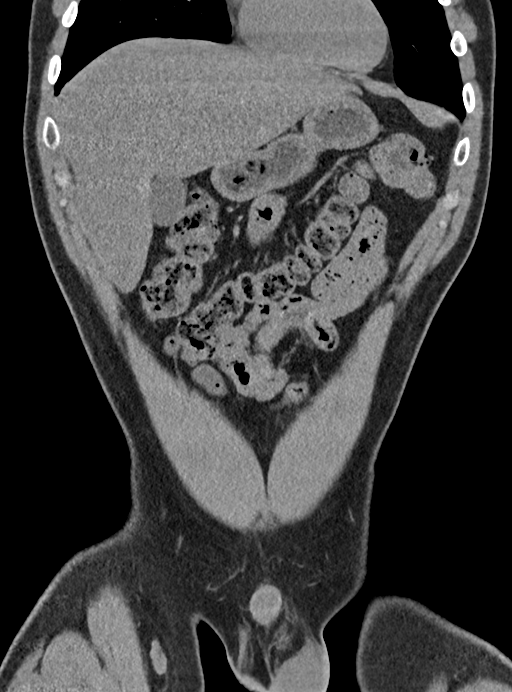
[im 73/145  soft-tissue]
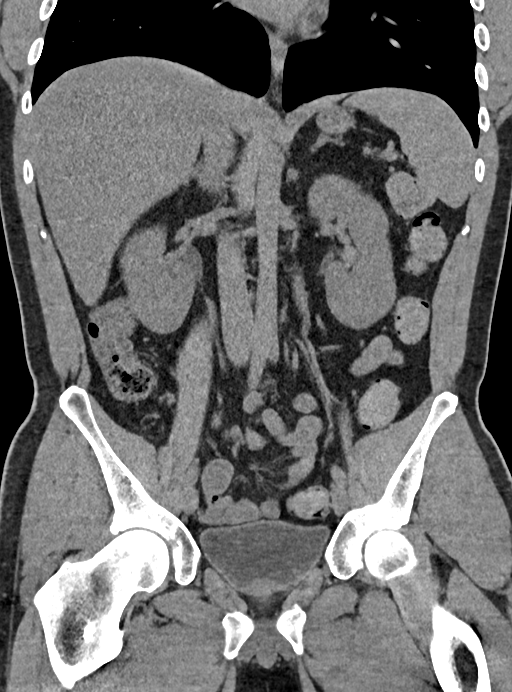
[im 109/145  soft-tissue]
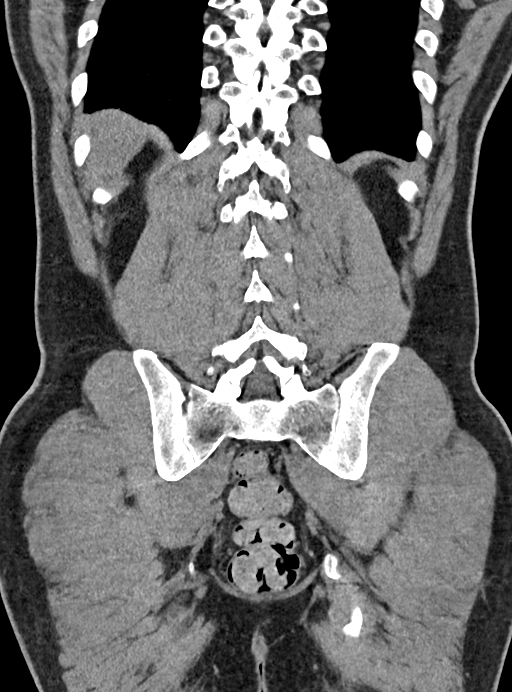

[12 of 46 positions shown; findings below may reference images not displayed]

FINDINGS: Lower chest: No acute abnormality.

Hepatobiliary: No solid liver abnormality is seen. Hepatic
steatosis. No gallstones, gallbladder wall thickening, or biliary
dilatation.

Pancreas: Unremarkable. No pancreatic ductal dilatation or
surrounding inflammatory changes.

Spleen: Normal in size without significant abnormality.

Adrenals/Urinary Tract: Adrenal glands are unremarkable.
Nonobstructive calculus of the inferior pole of the right kidney. No
hydronephrosis. Bladder is unremarkable.

Stomach/Bowel: Stomach is within normal limits. Appendix appears
normal. No evidence of bowel wall thickening, distention, or
inflammatory changes.

Vascular/Lymphatic: No significant vascular findings are present. No
enlarged abdominal or pelvic lymph nodes.

Reproductive: No mass or other significant abnormality.

Other: No abdominal wall hernia or abnormality. No abdominopelvic
ascites.

Musculoskeletal: No acute or significant osseous findings.
IMPRESSION: 1. Nonobstructive right nephrolithiasis. No hydronephrosis.
2. No mass or urinary tract filling defect.
3. Hepatic steatosis.

## 2021-12-12 IMAGING — CR DG ABDOMEN 1V
1 series · 2 of 2 positions shown · non-contrast
Comparison: CT from 07/18/2019

CLINICAL DATA: Preop evaluation for upcoming lithotripsy, history
of right renal stone

EXAM:
ABDOMEN - 1 VIEW

[Series 1: dg abd 1 view · 0.14mm/px · 2 of 2 slices shown]
[im 1/2]
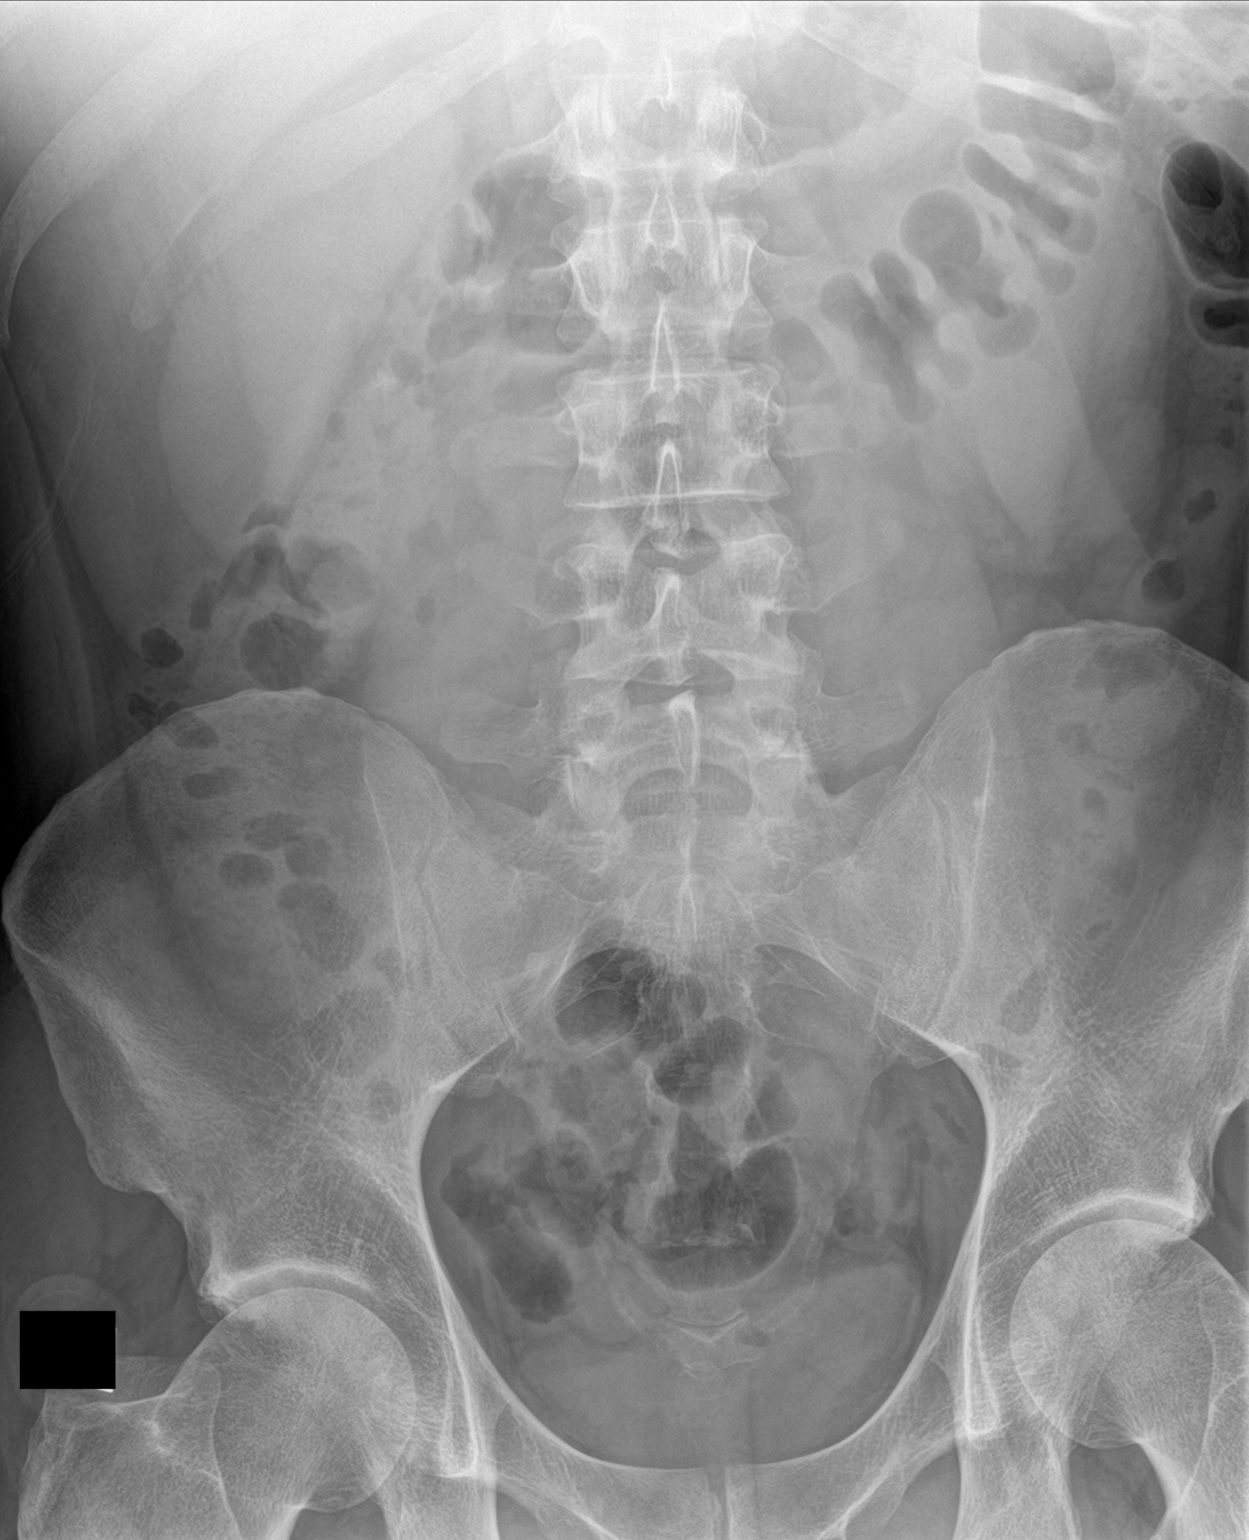
[im 2/2]
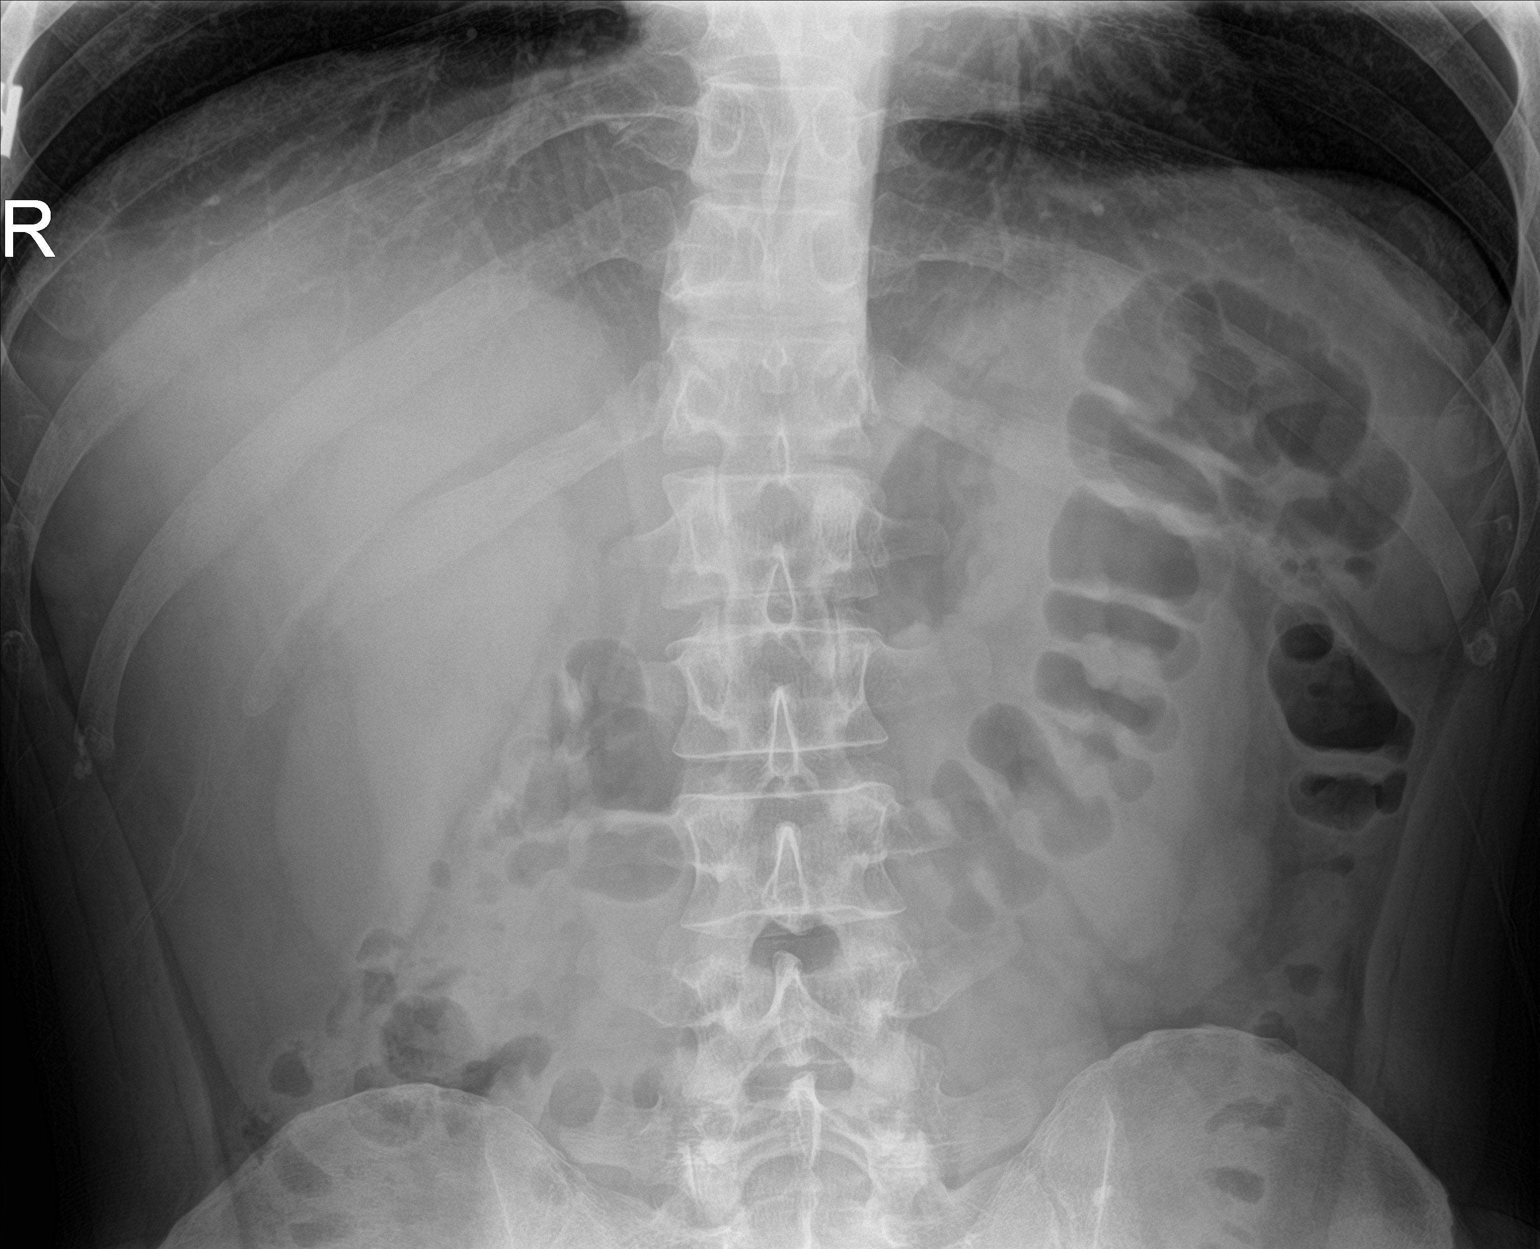

[2 of 2 positions shown; findings below may reference images not displayed]

FINDINGS: Scattered large and small bowel gas is noted. No abnormal mass is
noted. Stable right renal calculus is noted in the mid to lower
kidney. No ureteral stones are seen. No bony abnormality is noted.
IMPRESSION: Stable right renal calculus.

## 2022-01-03 ENCOUNTER — Other Ambulatory Visit: Payer: Self-pay | Admitting: Family Medicine

## 2022-01-03 ENCOUNTER — Encounter: Payer: Self-pay | Admitting: Family Medicine

## 2022-01-03 DIAGNOSIS — E1169 Type 2 diabetes mellitus with other specified complication: Secondary | ICD-10-CM

## 2022-01-03 MED ORDER — ROSUVASTATIN CALCIUM 5 MG PO TABS: 5.0000 mg | ORAL_TABLET | Freq: Every day | ORAL | 1 refills | Status: DC

## 2022-01-09 ENCOUNTER — Other Ambulatory Visit: Payer: Self-pay | Admitting: Family Medicine

## 2022-01-09 DIAGNOSIS — E1122 Type 2 diabetes mellitus with diabetic chronic kidney disease: Secondary | ICD-10-CM

## 2022-01-27 ENCOUNTER — Ambulatory Visit: Payer: 59 | Admitting: Family Medicine

## 2022-01-27 ENCOUNTER — Encounter: Payer: Self-pay | Admitting: Family Medicine

## 2022-01-27 VITALS — BP 129/79 | HR 80 | Resp 16 | Wt 230.0 lb

## 2022-01-27 DIAGNOSIS — Z23 Encounter for immunization: Secondary | ICD-10-CM

## 2022-01-27 DIAGNOSIS — E1169 Type 2 diabetes mellitus with other specified complication: Secondary | ICD-10-CM

## 2022-01-27 DIAGNOSIS — E669 Obesity, unspecified: Secondary | ICD-10-CM

## 2022-01-27 DIAGNOSIS — E785 Hyperlipidemia, unspecified: Secondary | ICD-10-CM | POA: Diagnosis not present

## 2022-01-27 NOTE — Progress Notes (Signed)
I,Francisco Andrews,acting as a scribe for Lelon Huh, MD.,have documented all relevant documentation on the behalf of Lelon Huh, MD,as directed by  Lelon Huh, MD while in the presence of Lelon Huh, MD.   Established patient visit   Patient: Francisco Andrews   DOB: Aug 11, 1985   36 y.o. Male  MRN: [id number] Visit Date: 01/27/2022  Today's healthcare provider: Lelon Huh, MD   Chief Complaint  Patient presents with   Follow-up   Diabetes   Hyperlipidemia   Subjective    HPI  Diabetes Mellitus Type II, follow-up  Lab Results  Component Value Date   HGBA1C 7.6 (H) 10/07/2021   HGBA1C 6.8 (H) 10/22/2020   HGBA1C 7.0 (H) 12/30/2019   Last seen for diabetes 3 months ago.  Management since then includes continuing the same treatment.  Home blood sugar records: fasting range: not checking Most Recent Eye Exam: not going    Was advised to be more strict with diet after labs done in August. Continued metformin.                                          Lab Results  Component Value Date   CHOL 241 (H) 10/07/2021   HDL 42 10/07/2021   LDLCALC 173 (H) 10/07/2021   LDLDIRECT 173 (H) 10/07/2021   TRIG 144 10/07/2021   CHOLHDL 5.7 (H) 10/07/2021   Was started on rosuvastatin after labs done in August.  ---------------------------------------------------------------------------------------------------   Medications: Outpatient Medications Prior to Visit  Medication Sig   metFORMIN (GLUCOPHAGE) 1000 MG tablet TAKE 1 TABLET (1,000 MG TOTAL) BY MOUTH TWICE A DAY WITH FOOD   rosuvastatin (CRESTOR) 5 MG tablet Take 1 tablet (5 mg total) by mouth daily.   No facility-administered medications prior to visit.    Review of Systems  Constitutional:  Negative for appetite change, chills and fever.  Respiratory:  Negative for chest tightness, shortness of breath and wheezing.   Cardiovascular:  Negative for chest pain and palpitations.  Gastrointestinal:  Negative  for abdominal pain, nausea and vomiting.       Objective    BP 129/79 (BP Location: Left Arm, Patient Position: Sitting, Cuff Size: Large)   Pulse 80   Resp 16   Wt 230 lb (104.3 kg)   SpO2 97%   BMI 31.19 kg/m    Physical Exam   General appearance: Obese male, cooperative and in no acute distress Head: Normocephalic, without obvious abnormality, atraumatic Respiratory: Respirations even and unlabored, normal respiratory rate Extremities: All extremities are intact.  Skin: Skin color, texture, turgor normal. No rashes seen  Psych: Appropriate mood and affect. Neurologic: Mental status: Alert, oriented to person, place, and time, thought content appropriate.    Assessment & Plan     1. Type 2 diabetes mellitus with obesity ([place]) Doing well on metformin. Advised he needs to schedule diabetic eye exam.   - Hemoglobin A1c - Comprehensive metabolic panel - CBC  2. Hyperlipidemia associated with type 2 diabetes mellitus ([place]) He is tolerating rosuvastatin well with no adverse effects.   - Lipid panel  Flu vaccine given today. l      The entirety of the information documented in the History of Present Illness, Review of Systems and Physical Exam were personally obtained by me. Portions of this information were initially documented by the CMA and reviewed by me for thoroughness  and accuracy. Mila Merry, MD  [facility] [phone] (phone) [phone] (fax)  [facility]

## 2022-01-27 NOTE — Patient Instructions (Signed)
.   Please review the attached list of medications and notify my office if there are any errors.   . Please contact your eyecare professional to schedule a routine eye exam  

## 2022-01-28 LAB — COMPREHENSIVE METABOLIC PANEL
ALT: 58 IU/L — ABNORMAL HIGH (ref 0–44)
AST: 34 IU/L (ref 0–40)
Albumin/Globulin Ratio: 2.3 — ABNORMAL HIGH (ref 1.2–2.2)
Albumin: 5.3 g/dL — ABNORMAL HIGH (ref 4.1–5.1)
Alkaline Phosphatase: 97 IU/L (ref 44–121)
BUN/Creatinine Ratio: 13 (ref 9–20)
BUN: 13 mg/dL (ref 6–20)
Bilirubin Total: 0.6 mg/dL (ref 0.0–1.2)
CO2: 25 mmol/L (ref 20–29)
Calcium: 10 mg/dL (ref 8.7–10.2)
Chloride: 98 mmol/L (ref 96–106)
Creatinine, Ser: 1.01 mg/dL (ref 0.76–1.27)
Globulin, Total: 2.3 g/dL (ref 1.5–4.5)
Glucose: 128 mg/dL — ABNORMAL HIGH (ref 70–99)
Potassium: 4.4 mmol/L (ref 3.5–5.2)
Sodium: 139 mmol/L (ref 134–144)
Total Protein: 7.6 g/dL (ref 6.0–8.5)
eGFR: 99 mL/min/{1.73_m2} (ref >59)

## 2022-01-28 LAB — LIPID PANEL
Chol/HDL Ratio: 3.8 ratio (ref 0.0–5.0)
Cholesterol, Total: 173 mg/dL (ref 100–199)
HDL: 46 mg/dL (ref >39)
LDL Chol Calc (NIH): 100 mg/dL — ABNORMAL HIGH (ref 0–99)
Triglycerides: 154 mg/dL — ABNORMAL HIGH (ref 0–149)
VLDL Cholesterol Cal: 27 mg/dL (ref 5–40)

## 2022-01-28 LAB — CBC
Hematocrit: 44.8 % (ref 37.5–51.0)
Hemoglobin: 14.8 g/dL (ref 13.0–17.7)
MCH: 29.3 pg (ref 26.6–33.0)
MCHC: 33 g/dL (ref 31.5–35.7)
MCV: 89 fL (ref 79–97)
Platelets: 321 10*3/uL (ref 150–450)
RBC: 5.05 x10E6/uL (ref 4.14–5.80)
RDW: 12.1 % (ref 11.6–15.4)
WBC: 8.2 10*3/uL (ref 3.4–10.8)

## 2022-01-28 LAB — HEMOGLOBIN A1C
Est. average glucose Bld gHb Est-mCnc: 183 mg/dL
Hgb A1c MFr Bld: 8 % — ABNORMAL HIGH (ref 4.8–5.6)

## 2022-02-05 ENCOUNTER — Other Ambulatory Visit: Payer: Self-pay | Admitting: Family Medicine

## 2022-02-05 DIAGNOSIS — E1169 Type 2 diabetes mellitus with other specified complication: Secondary | ICD-10-CM

## 2022-02-16 ENCOUNTER — Other Ambulatory Visit: Payer: Self-pay | Admitting: Family Medicine

## 2022-02-16 DIAGNOSIS — E1169 Type 2 diabetes mellitus with other specified complication: Secondary | ICD-10-CM

## 2022-02-16 NOTE — Telephone Encounter (Signed)
Requested Prescriptions  Pending Prescriptions Disp Refills   rosuvastatin (CRESTOR) 5 MG tablet [Pharmacy Med Name: ROSUVASTATIN CALCIUM 5 MG TAB] 90 tablet 0    Sig: TAKE 1 TABLET (5 MG TOTAL) BY MOUTH DAILY.     Cardiovascular:  Antilipid - Statins 2 Failed - 02/16/2022  4:49 AM      Failed - Lipid Panel in normal range within the last 12 months    Cholesterol, Total  Date Value Ref Range Status  01/27/2022 173 100 - 199 mg/dL Final   LDL Chol Calc (NIH)  Date Value Ref Range Status  01/27/2022 100 (H) 0 - 99 mg/dL Final   LDL Direct  Date Value Ref Range Status  10/07/2021 173 (H) 0 - 99 mg/dL Final   HDL  Date Value Ref Range Status  01/27/2022 46 >39 mg/dL Final   Triglycerides  Date Value Ref Range Status  01/27/2022 154 (H) 0 - 149 mg/dL Final         Passed - Cr in normal range and within 360 days    Creatinine, Ser  Date Value Ref Range Status  01/27/2022 1.01 0.76 - 1.27 mg/dL Final         Passed - Patient is not pregnant      Passed - Valid encounter within last 12 months    Recent Outpatient Visits           2 weeks ago Type 2 diabetes mellitus with obesity ([facility])   [facility] Malva Limes, MD   4 months ago Type 2 diabetes mellitus without complication, without long-term current use of insulin (H[facility])   [facility] Malva Limes, MD   1 year ago Type 2 diabetes mellitus without complication, without long-term current use of insulin [facility])   [facility] Merita Norton T, FNP   2 years ago Type 2 diabetes mellitus without complication, without long-term current use of insulin (HCC)   [facility] Malva Limes, MD   2 years ago Type 2 diabetes mellitus with chronic kidney disease, without long-term current use of insulin, unspecified CKD stage [facility])   [facility] Fisher, Demetrios Isaacs, MD

## 2022-04-15 ENCOUNTER — Other Ambulatory Visit: Payer: Self-pay | Admitting: Family Medicine

## 2022-04-15 DIAGNOSIS — E1122 Type 2 diabetes mellitus with diabetic chronic kidney disease: Secondary | ICD-10-CM

## 2022-06-13 ENCOUNTER — Other Ambulatory Visit: Payer: Self-pay | Admitting: Family Medicine

## 2022-06-13 DIAGNOSIS — E1169 Type 2 diabetes mellitus with other specified complication: Secondary | ICD-10-CM

## 2022-07-13 ENCOUNTER — Other Ambulatory Visit: Payer: Self-pay | Admitting: Family Medicine

## 2022-07-13 DIAGNOSIS — E1122 Type 2 diabetes mellitus with diabetic chronic kidney disease: Secondary | ICD-10-CM

## 2022-07-13 NOTE — Telephone Encounter (Signed)
Requested Prescriptions  Pending Prescriptions Disp Refills   metFORMIN (GLUCOPHAGE) 1000 MG tablet [Pharmacy Med Name: METFORMIN HCL 1,000 MG TABLET] 180 tablet 0    Sig: TAKE 1 TABLET (1,000 MG TOTAL) BY MOUTH TWICE A DAY WITH FOOD     Endocrinology:  Diabetes - Biguanides Failed - 07/13/2022  2:30 AM      Failed - HBA1C is between 0 and 7.9 and within 180 days    Hgb A1c MFr Bld  Date Value Ref Range Status  01/27/2022 8.0 (H) 4.8 - 5.6 % Final    Comment:             Prediabetes: 5.7 - 6.4          Diabetes: >6.4          Glycemic control for adults with diabetes: <7.0          Failed - CBC within normal limits and completed in the last 12 months    WBC  Date Value Ref Range Status  01/27/2022 8.2 3.4 - 10.8 x10E3/uL Final  09/24/2017 7.3 4.0 - 10.5 K/uL Final   RBC  Date Value Ref Range Status  01/27/2022 5.05 4.14 - 5.80 x10E6/uL Final  09/24/2017 5.75 4.22 - 5.81 MIL/uL Final   Hemoglobin  Date Value Ref Range Status  01/27/2022 14.8 13.0 - 17.7 g/dL Final   Total hemoglobin  Date Value Ref Range Status  07/21/2013 15.9 13.5 - 18.0 g/dL Final   Hematocrit  Date Value Ref Range Status  01/27/2022 44.8 37.5 - 51.0 % Final   MCHC  Date Value Ref Range Status  01/27/2022 33.0 31.5 - 35.7 g/dL Final  09/81/1914 78.2 (H) 30.0 - 36.0 g/dL Final   [facility]  Date Value Ref Range Status  01/27/2022 29.3 26.6 - 33.0 pg Final  09/24/2017 31.0 26.0 - 34.0 pg Final   MCV  Date Value Ref Range Status  01/27/2022 89 79 - 97 fL Final   No results found for: "PLTCOUNTKUC", "LABPLAT", "POCPLA" RDW  Date Value Ref Range Status  01/27/2022 12.1 11.6 - 15.4 % Final         Passed - Cr in normal range and within 360 days    Creatinine, Ser  Date Value Ref Range Status  01/27/2022 1.01 0.76 - 1.27 mg/dL Final         Passed - eGFR in normal range and within 360 days    GFR calc Af Amer  Date Value Ref Range Status  12/30/2019 129 >59 mL/min/1.73 Final    Comment:     **In accordance with recommendations from the NKF-ASN Task force,**   [facility] is in the process of updating its eGFR calculation to the   2021 CKD-EPI creatinine equation that estimates kidney function   without a race variable.    GFR calc non Af Amer  Date Value Ref Range Status  12/30/2019 112 >59 mL/min/1.73 Final   eGFR  Date Value Ref Range Status  01/27/2022 99 >59 mL/min/1.73 Final         Passed - B12 Level in normal range and within 720 days    Vitamin B-12  Date Value Ref Range Status  10/22/2020 375 232 - 1,245 pg/mL Final         Passed - Valid encounter within last 6 months    Recent Outpatient Visits           5 months ago Type 2 diabetes mellitus with obesity (HCC)   [place]  [facility], Demetrios Isaacs, MD   9 months ago Type 2 diabetes mellitus without complication, without long-term current use of insulin (HCC)   [place] [facility] Malva Limes, MD   1 year ago Type 2 diabetes mellitus without complication, without long-term current use of insulin [facility])   [place] [facility] Merita Norton T, FNP   2 years ago Type 2 diabetes mellitus without complication, without long-term current use of insulin [facility])   [place] [facility] Malva Limes, MD   3 years ago Type 2 diabetes mellitus with chronic kidney disease, without long-term current use of insulin, unspecified CKD stage [facility])   [place] [facility] Malva Limes, MD

## 2022-08-16 ENCOUNTER — Ambulatory Visit: Payer: 59 | Admitting: Family Medicine

## 2022-08-16 ENCOUNTER — Encounter: Payer: Self-pay | Admitting: Family Medicine

## 2022-08-16 VITALS — BP 130/70 | HR 99 | Temp 98.4°F | Resp 15 | Ht 73.0 in | Wt 252.2 lb

## 2022-08-16 DIAGNOSIS — E669 Obesity, unspecified: Secondary | ICD-10-CM | POA: Diagnosis not present

## 2022-08-16 DIAGNOSIS — E1169 Type 2 diabetes mellitus with other specified complication: Secondary | ICD-10-CM | POA: Diagnosis not present

## 2022-08-16 DIAGNOSIS — E785 Hyperlipidemia, unspecified: Secondary | ICD-10-CM | POA: Diagnosis not present
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
## 2022-08-17 ENCOUNTER — Other Ambulatory Visit: Payer: Self-pay | Admitting: Family Medicine

## 2022-08-17 DIAGNOSIS — E1169 Type 2 diabetes mellitus with other specified complication: Secondary | ICD-10-CM

## 2022-08-17 LAB — COMPREHENSIVE METABOLIC PANEL
ALT: 62 IU/L — ABNORMAL HIGH (ref 0–44)
AST: 40 IU/L (ref 0–40)
Albumin: 5.1 g/dL (ref 4.1–5.1)
Alkaline Phosphatase: 112 IU/L (ref 44–121)
BUN/Creatinine Ratio: 12 (ref 9–20)
BUN: 15 mg/dL (ref 6–20)
Bilirubin Total: 0.5 mg/dL (ref 0.0–1.2)
CO2: 24 mmol/L (ref 20–29)
Calcium: 10.3 mg/dL — ABNORMAL HIGH (ref 8.7–10.2)
Chloride: 96 mmol/L (ref 96–106)
Creatinine, Ser: 1.21 mg/dL (ref 0.76–1.27)
Globulin, Total: 2.6 g/dL (ref 1.5–4.5)
Glucose: 179 mg/dL — ABNORMAL HIGH (ref 70–99)
Potassium: 4.8 mmol/L (ref 3.5–5.2)
Sodium: 137 mmol/L (ref 134–144)
Total Protein: 7.7 g/dL (ref 6.0–8.5)
eGFR: 80 mL/min/{1.73_m2} (ref >59)

## 2022-08-17 LAB — HEMOGLOBIN A1C
Est. average glucose Bld gHb Est-mCnc: 237 mg/dL
Hgb A1c MFr Bld: 9.9 % — ABNORMAL HIGH (ref 4.8–5.6)

## 2022-08-17 LAB — LIPID PANEL
Chol/HDL Ratio: 4.1 ratio (ref 0.0–5.0)
Cholesterol, Total: 186 mg/dL (ref 100–199)
HDL: 45 mg/dL (ref >39)
LDL Chol Calc (NIH): 109 mg/dL — ABNORMAL HIGH (ref 0–99)
Triglycerides: 185 mg/dL — ABNORMAL HIGH (ref 0–149)
VLDL Cholesterol Cal: 32 mg/dL (ref 5–40)

## 2022-08-17 LAB — LDL CHOLESTEROL, DIRECT: LDL Direct: 114 mg/dL — ABNORMAL HIGH (ref 0–99)

## 2022-08-17 MED ORDER — RYBELSUS 3 MG PO TABS
3.0000 mg | ORAL_TABLET | Freq: Every day | ORAL | 0 refills | Status: DC
Start: 2022-08-17 — End: 2022-09-11

## 2022-09-11 ENCOUNTER — Other Ambulatory Visit: Payer: Self-pay | Admitting: Family Medicine

## 2022-09-11 DIAGNOSIS — E1169 Type 2 diabetes mellitus with other specified complication: Secondary | ICD-10-CM

## 2022-09-11 MED ORDER — RYBELSUS 7 MG PO TABS
7.0000 mg | ORAL_TABLET | Freq: Every day | ORAL | 1 refills | Status: DC
Start: 2022-09-11 — End: 2022-11-13

## 2022-10-12 ENCOUNTER — Other Ambulatory Visit: Payer: Self-pay | Admitting: Family Medicine

## 2022-10-12 DIAGNOSIS — E1122 Type 2 diabetes mellitus with diabetic chronic kidney disease: Secondary | ICD-10-CM

## 2022-10-13 NOTE — Telephone Encounter (Signed)
Requested Prescriptions  Pending Prescriptions Disp Refills   metFORMIN (GLUCOPHAGE) 1000 MG tablet [Pharmacy Med Name: METFORMIN HCL 1,000 MG TABLET] 180 tablet 1    Sig: TAKE 1 TABLET (1,000 MG TOTAL) BY MOUTH TWICE A DAY WITH FOOD     Endocrinology:  Diabetes - Biguanides Failed - 10/12/2022  2:34 AM      Failed - HBA1C is between 0 and 7.9 and within 180 days    Hgb A1c MFr Bld  Date Value Ref Range Status  08/16/2022 9.9 (H) 4.8 - 5.6 % Final    Comment:             Prediabetes: 5.7 - 6.4          Diabetes: >6.4          Glycemic control for adults with diabetes: <7.0          Failed - CBC within normal limits and completed in the last 12 months    WBC  Date Value Ref Range Status  01/27/2022 8.2 3.4 - 10.8 x10E3/uL Final  09/24/2017 7.3 4.0 - 10.5 K/uL Final   RBC  Date Value Ref Range Status  01/27/2022 5.05 4.14 - 5.80 x10E6/uL Final  09/24/2017 5.75 4.22 - 5.81 MIL/uL Final   Hemoglobin  Date Value Ref Range Status  01/27/2022 14.8 13.0 - 17.7 g/dL Final   Total hemoglobin  Date Value Ref Range Status  07/21/2013 15.9 13.5 - 18.0 g/dL Final   Hematocrit  Date Value Ref Range Status  01/27/2022 44.8 37.5 - 51.0 % Final   MCHC  Date Value Ref Range Status  01/27/2022 33.0 31.5 - 35.7 g/dL Final  40/98/1191 47.8 (H) 30.0 - 36.0 g/dL Final   [facility]  Date Value Ref Range Status  01/27/2022 29.3 26.6 - 33.0 pg Final  09/24/2017 31.0 26.0 - 34.0 pg Final   MCV  Date Value Ref Range Status  01/27/2022 89 79 - 97 fL Final   No results found for: "PLTCOUNTKUC", "LABPLAT", "POCPLA" RDW  Date Value Ref Range Status  01/27/2022 12.1 11.6 - 15.4 % Final         Passed - Cr in normal range and within 360 days    Creatinine, Ser  Date Value Ref Range Status  08/16/2022 1.21 0.76 - 1.27 mg/dL Final         Passed - eGFR in normal range and within 360 days    GFR calc Af Amer  Date Value Ref Range Status  12/30/2019 129 >59 mL/min/1.73 Final    Comment:     **In accordance with recommendations from the NKF-ASN Task force,**   [facility] is in the process of updating its eGFR calculation to the   2021 CKD-EPI creatinine equation that estimates kidney function   without a race variable.    GFR calc non Af Amer  Date Value Ref Range Status  12/30/2019 112 >59 mL/min/1.73 Final   eGFR  Date Value Ref Range Status  08/16/2022 80 >59 mL/min/1.73 Final         Passed - B12 Level in normal range and within 720 days    Vitamin B-12  Date Value Ref Range Status  10/22/2020 375 232 - 1,245 pg/mL Final         Passed - Valid encounter within last 6 months    Recent Outpatient Visits           1 month ago Type 2 diabetes mellitus with obesity (HCC)   [place]  [facility] Limes, MD   8 months ago Type 2 diabetes mellitus with obesity [facility])   [place] [facility] Malva Limes, MD   1 year ago Type 2 diabetes mellitus without complication, without long-term current use of insulin [facility])   [place] Malva Limes, MD   1 year ago Type 2 diabetes mellitus without complication, without long-term current use of insulin [facility])   [place] [facility] Merita Norton T, FNP   2 years ago Type 2 diabetes mellitus without complication, without long-term current use of insulin (HCC)   [place] [facility] Sherrie Mustache, Demetrios Isaacs, MD

## 2022-11-11 ENCOUNTER — Other Ambulatory Visit: Payer: Self-pay | Admitting: Family Medicine

## 2022-11-11 DIAGNOSIS — E1169 Type 2 diabetes mellitus with other specified complication: Secondary | ICD-10-CM

## 2022-11-13 NOTE — Telephone Encounter (Signed)
Requested medication (s) are due for refill today - yes  Requested medication (s) are on the active medication list -yes  Future visit scheduled -no  Last refill: 09/11/22 #30 1RF  Notes to clinic: off protocol- provider review   Requested Prescriptions  Pending Prescriptions Disp Refills   RYBELSUS 7 MG TABS [Pharmacy Med Name: RYBELSUS 7 MG TABLET] 30 tablet 1    Sig: Take 1 tablet (7 mg total) by mouth daily.     Off-Protocol Failed - 11/11/2022  1:05 AM      Failed - Medication not assigned to a protocol, review manually.      Passed - Valid encounter within last 12 months    Recent Outpatient Visits           2 months ago Type 2 diabetes mellitus with obesity (HCC)   [place] [facility] Malva Limes, MD   9 months ago Type 2 diabetes mellitus with obesity [facility])   [place] [facility] Malva Limes, MD   1 year ago Type 2 diabetes mellitus without complication, without long-term current use of insulin (HCC)   [place] [facility] Malva Limes, MD   2 years ago Type 2 diabetes mellitus without complication, without long-term current use of insulin [facility])   [place] [facility] Merita Norton T, FNP   2 years ago Type 2 diabetes mellitus without complication, without long-term current use of insulin (HCC)   [place] [facility] Malva Limes, MD                 Requested Prescriptions  Pending Prescriptions Disp Refills   RYBELSUS 7 MG TABS [Pharmacy Med Name: RYBELSUS 7 MG TABLET] 30 tablet 1    Sig: Take 1 tablet (7 mg total) by mouth daily.     Off-Protocol Failed - 11/11/2022  1:05 AM      Failed - Medication not assigned to a protocol, review manually.      Passed - Valid encounter within last 12 months    Recent Outpatient Visits           2 months ago Type 2 diabetes mellitus with obesity (HCC)   [place] [facility] Malva Limes,  MD   9 months ago Type 2 diabetes mellitus with obesity [facility])   [place] [facility] Malva Limes, MD   1 year ago Type 2 diabetes mellitus without complication, without long-term current use of insulin (HCC)   [facility] Malva Limes, MD   2 years ago Type 2 diabetes mellitus without complication, without long-term current use of insulin [facility])   [place] [facility] Merita Norton T, FNP   2 years ago Type 2 diabetes mellitus without complication, without long-term current use of insulin (HCC)   [place] [facility] Sherrie Mustache, Demetrios Isaacs, MD

## 2023-01-09 ENCOUNTER — Other Ambulatory Visit: Payer: Self-pay | Admitting: Family Medicine

## 2023-01-09 DIAGNOSIS — E1169 Type 2 diabetes mellitus with other specified complication: Secondary | ICD-10-CM

## 2023-01-09 NOTE — Telephone Encounter (Signed)
Requested medication (s) are due for refill today -yes  Requested medication (s) are on the active medication list -yes  Future visit scheduled -no  Last refill: 11/13/22 #30 1RF  Notes to clinic: off protocol- provider review   Requested Prescriptions  Pending Prescriptions Disp Refills   RYBELSUS 7 MG TABS [Pharmacy Med Name: RYBELSUS 7 MG TABLET] 30 tablet 1    Sig: TAKE 1 TABLET (7 MG TOTAL) BY MOUTH DAILY     Off-Protocol Failed - 01/09/2023  2:32 AM      Failed - Medication not assigned to a protocol, review manually.      Passed - Valid encounter within last 12 months    Recent Outpatient Visits           4 months ago Type 2 diabetes mellitus with obesity (HCC)   [place] [facility] Malva Limes, MD   11 months ago Type 2 diabetes mellitus with obesity [facility])   [place] Malva Limes, MD   1 year ago Type 2 diabetes mellitus without complication, without long-term current use of insulin (HCC)   [place] [facility] Malva Limes, MD   2 years ago Type 2 diabetes mellitus without complication, without long-term current use of insulin [facility])   [place] [facility] Merita Norton T, FNP   3 years ago Type 2 diabetes mellitus without complication, without long-term current use of insulin (HCC)   [place] [facility] Malva Limes, MD                 Requested Prescriptions  Pending Prescriptions Disp Refills   RYBELSUS 7 MG TABS [Pharmacy Med Name: RYBELSUS 7 MG TABLET] 30 tablet 1    Sig: TAKE 1 TABLET (7 MG TOTAL) BY MOUTH DAILY     Off-Protocol Failed - 01/09/2023  2:32 AM      Failed - Medication not assigned to a protocol, review manually.      Passed - Valid encounter within last 12 months    Recent Outpatient Visits           4 months ago Type 2 diabetes mellitus with obesity (HCC)   [place] [facility] Malva Limes,  MD   11 months ago Type 2 diabetes mellitus with obesity [facility])   [place] [facility] Malva Limes, MD   1 year ago Type 2 diabetes mellitus without complication, without long-term current use of insulin (HCC)   [place] [facility] Malva Limes, MD   2 years ago Type 2 diabetes mellitus without complication, without long-term current use of insulin [facility])   [place] [facility] Merita Norton T, FNP   3 years ago Type 2 diabetes mellitus without complication, without long-term current use of insulin (HCC)   [place] [facility] Sherrie Mustache, Demetrios Isaacs, MD

## 2023-03-08 ENCOUNTER — Other Ambulatory Visit: Payer: Self-pay | Admitting: Family Medicine

## 2023-03-08 DIAGNOSIS — E1122 Type 2 diabetes mellitus with diabetic chronic kidney disease: Secondary | ICD-10-CM

## 2023-03-10 ENCOUNTER — Other Ambulatory Visit: Payer: Self-pay | Admitting: Family Medicine

## 2023-03-10 DIAGNOSIS — E1169 Type 2 diabetes mellitus with other specified complication: Secondary | ICD-10-CM

## 2023-03-12 NOTE — Telephone Encounter (Signed)
 Requested medication (s) are due for refill today - yes  Requested medication (s) are on the active medication list -yes  Future visit scheduled -no  Last refill: 01/09/23 #30 1RF  Notes to clinic: off protocol- provider review   Requested Prescriptions  Pending Prescriptions Disp Refills   RYBELSUS  7 MG TABS [Pharmacy Med Name: RYBELSUS  7 MG TABLET] 30 tablet 1    Sig: TAKE 1 TABLET (7 MG TOTAL) BY MOUTH DAILY     Off-Protocol Failed - 03/12/2023 12:16 PM      Failed - Medication not assigned to a protocol, review manually.      Passed - Valid encounter within last 12 months    Recent Outpatient Visits           6 months ago Type 2 diabetes mellitus with obesity (HCC)   [place] [facility] Gasper Nancyann BRAVO, MD   1 year ago Type 2 diabetes mellitus with obesity [facility])   [place] [facility] Gasper Nancyann BRAVO, MD   1 year ago Type 2 diabetes mellitus without complication, without long-term current use of insulin  [facility])   [place] [facility] Gasper Nancyann BRAVO, MD   2 years ago Type 2 diabetes mellitus without complication, without long-term current use of insulin  [facility])   [place] [facility] Emilio Marseille T, FNP   3 years ago Type 2 diabetes mellitus without complication, without long-term current use of insulin  [facility])   [place] [facility] Gasper Nancyann BRAVO, MD                 Requested Prescriptions  Pending Prescriptions Disp Refills   RYBELSUS  7 MG TABS [Pharmacy Med Name: RYBELSUS  7 MG TABLET] 30 tablet 1    Sig: TAKE 1 TABLET (7 MG TOTAL) BY MOUTH DAILY     Off-Protocol Failed - 03/12/2023 12:16 PM      Failed - Medication not assigned to a protocol, review manually.      Passed - Valid encounter within last 12 months    Recent Outpatient Visits           6 months ago Type 2 diabetes mellitus with obesity (HCC)   [place] [facility] Gasper Nancyann BRAVO, MD    1 year ago Type 2 diabetes mellitus with obesity [facility])   [place] [facility] Gasper Nancyann BRAVO, MD   1 year ago Type 2 diabetes mellitus without complication, without long-term current use of insulin  [facility] [facility])   [place] [facility] Gasper Nancyann BRAVO, MD   2 years ago Type 2 diabetes mellitus without complication, without long-term current use of insulin  [facility])   [place] [facility] Emilio Marseille T, FNP   3 years ago Type 2 diabetes mellitus without complication, without long-term current use of insulin  [facility])    [facility] Gasper, Nancyann BRAVO, MD

## 2023-04-08 ENCOUNTER — Other Ambulatory Visit: Payer: Self-pay | Admitting: Family Medicine

## 2023-04-08 DIAGNOSIS — E1122 Type 2 diabetes mellitus with diabetic chronic kidney disease: Secondary | ICD-10-CM

## 2023-04-12 ENCOUNTER — Other Ambulatory Visit: Payer: Self-pay | Admitting: Family Medicine

## 2023-04-12 DIAGNOSIS — E1122 Type 2 diabetes mellitus with diabetic chronic kidney disease: Secondary | ICD-10-CM

## 2023-04-12 NOTE — Telephone Encounter (Signed)
Requested medications are due for refill today.  yes  Requested medications are on the active medications list.  yes  Last refill. 03/09/2023 #60 0 rf  Future visit scheduled.   no  Notes to clinic.  Labs are expired. Pt already given a courtesy refill.    Requested Prescriptions  Pending Prescriptions Disp Refills   metFORMIN (GLUCOPHAGE) 1000 MG tablet [Pharmacy Med Name: METFORMIN HCL 1,000 MG TABLET] 60 tablet 0    Sig: TAKE 1 TABLET (1,000 MG TOTAL) BY MOUTH TWICE A DAY WITH FOOD     Endocrinology:  Diabetes - Biguanides Failed - 04/12/2023  4:19 PM      Failed - HBA1C is between 0 and 7.9 and within 180 days    Hgb A1c MFr Bld  Date Value Ref Range Status  08/16/2022 9.9 (H) 4.8 - 5.6 % Final    Comment:             Prediabetes: 5.7 - 6.4          Diabetes: >6.4          Glycemic control for adults with diabetes: <7.0          Failed - B12 Level in normal range and within 720 days    Vitamin B-12  Date Value Ref Range Status  10/22/2020 375 232 - 1,245 pg/mL Final         Failed - Valid encounter within last 6 months    Recent Outpatient Visits           7 months ago Type 2 diabetes mellitus with obesity (HCC)   [place] [facility] Malva Limes, MD   1 year ago Type 2 diabetes mellitus with obesity (HCC)   [place] [facility] Malva Limes, MD   1 year ago Type 2 diabetes mellitus without complication, without long-term current use of insulin (HCC)   [place] [facility] Malva Limes, MD   2 years ago Type 2 diabetes mellitus without complication, without long-term current use of insulin (HCC)   [place] [facility] Merita Norton T, FNP   3 years ago Type 2 diabetes mellitus without complication, without long-term current use of insulin (HCC)   [place] [facility] Malva Limes, MD              Failed - CBC within normal limits and completed in the last  12 months    WBC  Date Value Ref Range Status  01/27/2022 8.2 3.4 - 10.8 x10E3/uL Final  09/24/2017 7.3 4.0 - 10.5 K/uL Final   RBC  Date Value Ref Range Status  01/27/2022 5.05 4.14 - 5.80 x10E6/uL Final  09/24/2017 5.75 4.22 - 5.81 MIL/uL Final   Hemoglobin  Date Value Ref Range Status  01/27/2022 14.8 13.0 - 17.7 g/dL Final   Total hemoglobin  Date Value Ref Range Status  07/21/2013 15.9 13.5 - 18.0 g/dL Final   Hematocrit  Date Value Ref Range Status  01/27/2022 44.8 37.5 - 51.0 % Final   MCHC  Date Value Ref Range Status  01/27/2022 33.0 31.5 - 35.7 g/dL Final  78/46/9629 52.8 (H) 30.0 - 36.0 g/dL Final   [facility]  Date Value Ref Range Status  01/27/2022 29.3 26.6 - 33.0 pg Final  09/24/2017 31.0 26.0 - 34.0 pg Final   MCV  Date Value Ref Range Status  01/27/2022 89 79 - 97 fL Final   No results found for: "  PLTCOUNTKUC", "LABPLAT", "POCPLA" RDW  Date Value Ref Range Status  01/27/2022 12.1 11.6 - 15.4 % Final         Passed - Cr in normal range and within 360 days    Creatinine, Ser  Date Value Ref Range Status  08/16/2022 1.21 0.76 - 1.27 mg/dL Final         Passed - eGFR in normal range and within 360 days    GFR calc Af Amer  Date Value Ref Range Status  12/30/2019 129 >59 mL/min/1.73 Final    Comment:    **In accordance with recommendations from the NKF-ASN Task force,**   [facility] is in the process of updating its eGFR calculation to the   2021 CKD-EPI creatinine equation that estimates kidney function   without a race variable.    GFR calc non Af Amer  Date Value Ref Range Status  12/30/2019 112 >59 mL/min/1.73 Final   eGFR  Date Value Ref Range Status  08/16/2022 80 >59 mL/min/1.73 Final

## 2023-06-10 ENCOUNTER — Other Ambulatory Visit: Payer: Self-pay | Admitting: Family Medicine

## 2023-06-10 DIAGNOSIS — E1169 Type 2 diabetes mellitus with other specified complication: Secondary | ICD-10-CM

## 2023-06-18 ENCOUNTER — Other Ambulatory Visit: Payer: Self-pay | Admitting: Family Medicine

## 2023-06-18 DIAGNOSIS — E1169 Type 2 diabetes mellitus with other specified complication: Secondary | ICD-10-CM

## 2023-06-18 NOTE — Telephone Encounter (Signed)
 Requested Prescriptions  Refused Prescriptions Disp Refills   rosuvastatin  (CRESTOR ) 5 MG tablet 30 tablet 5    Sig: Take 1 tablet (5 mg total) by mouth daily.     Cardiovascular:  Antilipid - Statins 2 Failed - 06/18/2023  5:58 PM      Failed - Valid encounter within last 12 months    Recent Outpatient Visits   None            Failed - Lipid Panel in normal range within the last 12 months    Cholesterol, Total  Date Value Ref Range Status  08/16/2022 186 100 - 199 mg/dL Final   LDL Chol Calc (NIH)  Date Value Ref Range Status  08/16/2022 109 (H) 0 - 99 mg/dL Final   LDL Direct  Date Value Ref Range Status  08/16/2022 114 (H) 0 - 99 mg/dL Final   HDL  Date Value Ref Range Status  08/16/2022 45 >39 mg/dL Final   Triglycerides  Date Value Ref Range Status  08/16/2022 185 (H) 0 - 149 mg/dL Final         Passed - Cr in normal range and within 360 days    Creatinine, Ser  Date Value Ref Range Status  08/16/2022 1.21 0.76 - 1.27 mg/dL Final         Passed - Patient is not pregnant

## 2023-06-18 NOTE — Telephone Encounter (Signed)
 Copied from CRM [id number]. Topic: Clinical - Medication Refill >> Jun 18, 2023  9:22 AM Leory Rands wrote: Most Recent Primary Care Visit:  Provider: Lamon Pillow  Department: [id number]  Visit Type: OFFICE VISIT  Date: 08/16/2022  Medication: rosuvastatin  (CRESTOR ) 5 MG tablet [[id number]] Apt schedule 07/03/2023    cy for the refill. If patient does not wish to contact the pharmacy document the reason why and proceed with request.) (Agent: If yes, when and what did the pharmacy advise?)  Is this the correct pharmacy for this prescription? Yes If no, delete pharmacy and type the correct one.  This is the patient's preferred pharmacy:  [id number] [place], [place] - [address] [place] [address] Phone: [phone] Fax: [phone]     Has the prescription been filled recently? Yes  Is the patient out of the medication? Yes  Has the patient been seen for an appointment in the last year OR does the patient have an upcoming appointment? Yes  Can we respond through MyChart? Yes  Agent: Please be advised that Rx refills may take up to 3 business days. We ask that you follow-up with your pharmacy.

## 2023-07-03 ENCOUNTER — Ambulatory Visit: Admitting: Family Medicine

## 2023-07-03 VITALS — BP 146/93 | HR 81 | Resp 16 | Wt 226.8 lb

## 2023-07-03 DIAGNOSIS — E1165 Type 2 diabetes mellitus with hyperglycemia: Secondary | ICD-10-CM | POA: Diagnosis not present

## 2023-07-03 DIAGNOSIS — Z7984 Long term (current) use of oral hypoglycemic drugs: Secondary | ICD-10-CM | POA: Diagnosis not present

## 2023-07-03 DIAGNOSIS — E785 Hyperlipidemia, unspecified: Secondary | ICD-10-CM

## 2023-07-03 DIAGNOSIS — E1169 Type 2 diabetes mellitus with other specified complication: Secondary | ICD-10-CM

## 2023-07-03 LAB — POCT GLYCOSYLATED HEMOGLOBIN (HGB A1C)
Est. average glucose Bld gHb Est-mCnc: 260
Hemoglobin A1C: 10.7 % — AB (ref 4.0–5.6)

## 2023-07-03 MED ORDER — RYBELSUS 14 MG PO TABS: 14.0000 mg | ORAL_TABLET | Freq: Every day | ORAL | 0 refills | Status: DC

## 2023-07-03 MED ORDER — ONETOUCH VERIO VI STRP: ORAL_STRIP | 12 refills | Status: AC

## 2023-07-03 MED ORDER — METFORMIN HCL 1000 MG PO TABS: 1000.0000 mg | ORAL_TABLET | Freq: Every day | ORAL | 1 refills | Status: DC

## 2023-07-03 NOTE — Progress Notes (Signed)
 Established patient visit   Patient: Francisco Andrews   DOB: January 31, 1986   38 y.o. Male  MRN: [id number] Visit Date: 07/03/2023  Today's healthcare provider: Jeralene Mom, MD   Chief Complaint  Patient presents with   Medical Management of Chronic Issues    T2DM   Subjective    Discussed the use of AI scribe software for clinical note transcription with the patient, who gave verbal consent to proceed.  History of Present Illness   Francisco Andrews is a 38 year old male with type 2 diabetes who presents for a follow-up on blood sugar management.  He has been taking Rybelsus  7mg  daily and experienced initial nausea during the first week, which has since resolved. 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 No current nausea, constipation, or cramping. No side effects were noted from metformin  when he was taking it.  His A1c has increased to 10.6. He often only has a protein shake in the morning and does not eat again until he returns home from work, finding it challenging to eat healthily on the go. He is still taking his cholesterol medication as prescribed.  His daily routine includes waking up at 3:00 to 3:30 AM and going to the gym by 4:00 AM, which impacts his ability to check his blood sugar in the mornings.     Lab Results  Component Value Date   HGBA1C 10.7 (A) 07/03/2023   HGBA1C 9.9 (H) 08/16/2022   HGBA1C 8.0 (H) 01/27/2022   Lab Results  Component Value Date   NA 137 08/16/2022   K 4.8 08/16/2022   CREATININE 1.21 08/16/2022   EGFR 80 08/16/2022   GLUCOSE 179 (H) 08/16/2022     Medications: Outpatient Medications Prior to Visit  Medication Sig   rosuvastatin  (CRESTOR ) 5 MG tablet TAKE 1 TABLET (5 MG TOTAL) BY MOUTH DAILY.   Semaglutide  (RYBELSUS ) 7 MG TABS TAKE 1 TABLET (7 MG TOTAL) BY MOUTH DAILY    metFORMIN  (GLUCOPHAGE ) 1000 MG tablet TAKE 1 TABLET (1,000 MG TOTAL) BY MOUTH TWICE A DAY WITH FOOD (Patient not taking: Reported on 07/03/2023)   No facility-administered medications prior to visit.   Review of Systems     Objective    BP (!) 146/93 (BP Location: Left Arm, Patient Position: Sitting, Cuff Size: Large)   Pulse 81   Resp 16   Wt 226 lb 12.8 oz (102.9 kg)   SpO2 100%   BMI 29.92 kg/m   Physical Exam   General appearance: Well developed, well nourished male, cooperative and in no acute distress Head: Normocephalic, without obvious abnormality, atraumatic Respiratory: Respirations even and unlabored, normal respiratory rate Extremities: All extremities are intact.  Skin: Skin color, texture, turgor normal. No rashes seen  Psych: Appropriate mood and affect. Neurologic: Mental status: Alert, oriented to person, place, and time, thought content appropriate.     Results for orders placed or performed in visit on 07/03/23  POCT glycosylated hemoglobin (Hb A1C)  Result Value Ref Range   Hemoglobin A1C 10.7 (A) 4.0 - 5.6 %   Est. average glucose Bld gHb Est-mCnc 260     Assessment & Plan     1. Type 2 diabetes mellitus with hyperlipidemia (HCC) (Primary)   2. Inadequately controlled diabetes mellitus (HCC) Tolerating initiation of Rybelus,  but has been out of metformin  for several weeks and A1c has gotten worse.  Resume metFORMIN  (GLUCOPHAGE ) 1000 MG tablet at just 1 tablet (1,000 mg total) by mouth daily.  Dispense: 90 tablet; Refill: 1  Increase Semaglutide  (RYBELSUS ) to 14 MG TABS; Take 1 tablet (14 mg total) by mouth daily.  Dispense: 90 tablet; Refill: 0  - glucose blood (ONETOUCH VERIO) test strip; Use to check blood sugar up to three times a day for uncontrolled type 2 diabetes (e11.65)  Dispense: 100 each; Refill: 12     Return in about 12 weeks (around 09/25/2023).      Jeralene Mom, MD  [facility] [phone]  (phone) [phone] (fax)  [facility]

## 2023-07-03 NOTE — Patient Instructions (Signed)
 Marland Kitchen  Please review the attached list of medications and notify my office if there are any errors.   . Please bring all of your medications to every appointment so we can make sure that our medication list is the same as yours.

## 2023-08-01 ENCOUNTER — Telehealth: Payer: Self-pay

## 2023-08-01 ENCOUNTER — Telehealth: Payer: Self-pay | Admitting: Family Medicine

## 2023-08-01 DIAGNOSIS — E1165 Type 2 diabetes mellitus with hyperglycemia: Secondary | ICD-10-CM

## 2023-08-01 MED ORDER — RYBELSUS 14 MG PO TABS: 14.0000 mg | ORAL_TABLET | Freq: Every day | ORAL | 1 refills | Status: DC

## 2023-08-01 NOTE — Telephone Encounter (Signed)
 Duplicate request

## 2023-08-01 NOTE — Telephone Encounter (Signed)
 Left Message to call back, since the [facility] Semaglutide  (RYBELSUS ) 14 MG TABS 90 tablet 0 07/03/2023 --   Sig - Route: Take 1 tablet (14 mg total) by mouth daily. - Oral   Sent to pharmacy as: Semaglutide  (RYBELSUS ) 14 MG Tab   E-Prescribing Status: Receipt confirmed by pharmacy (07/03/2023  8:45 AM EDT)   [id number] [place], [place] - [address]

## 2023-08-01 NOTE — Telephone Encounter (Signed)
 Copied from CRM [id number]. Topic: Clinical - Medication Refill >> Aug 01, 2023  8:18 AM Chasity T wrote: Medication: Semaglutide  (RYBELSUS ) 14 MG TABS   Has the patient contacted their pharmacy? Yes   This is the patient's preferred pharmacy:  [id number] [place], [place] - [address] [place] [address] Phone: [phone] Fax: [phone]  Is this the correct pharmacy for this prescription? Yes If no, delete pharmacy and type the correct one.   Has the prescription been filled recently? No  Is the patient out of the medication? No  Has the patient been seen for an appointment in the last year OR does the patient have an upcoming appointment? Yes  Can we respond through MyChart? Yes  Agent: Please be advised that Rx refills may take up to 3 business days. We ask that you follow-up with your pharmacy.

## 2023-09-25 ENCOUNTER — Ambulatory Visit: Admitting: Family Medicine

## 2023-09-28 ENCOUNTER — Ambulatory Visit: Admitting: Family Medicine

## 2023-09-28 ENCOUNTER — Encounter: Payer: Self-pay | Admitting: Family Medicine

## 2023-09-28 VITALS — BP 143/81 | HR 74 | Temp 97.5°F | Ht 73.0 in | Wt 220.4 lb

## 2023-09-28 DIAGNOSIS — E1169 Type 2 diabetes mellitus with other specified complication: Secondary | ICD-10-CM

## 2023-09-28 DIAGNOSIS — E785 Hyperlipidemia, unspecified: Secondary | ICD-10-CM | POA: Diagnosis not present

## 2023-09-28 DIAGNOSIS — Z7984 Long term (current) use of oral hypoglycemic drugs: Secondary | ICD-10-CM | POA: Diagnosis not present

## 2023-09-28 DIAGNOSIS — E1165 Type 2 diabetes mellitus with hyperglycemia: Secondary | ICD-10-CM

## 2023-09-28 NOTE — Patient Instructions (Signed)
 Marland Kitchen  Please review the attached list of medications and notify my office if there are any errors.   . Please bring all of your medications to every appointment so we can make sure that our medication list is the same as yours.

## 2023-09-28 NOTE — Progress Notes (Signed)
 Established patient visit   Patient: Francisco Andrews   DOB: 1985-09-18   38 y.o. Male  MRN: [id number] Visit Date: 09/28/2023  Today's healthcare provider: Nancyann Perry, MD   Chief Complaint  Patient presents with   Medical Management of Chronic Issues    Patient reports he is feeling well overall   Subjective    Discussed the use of AI scribe software for clinical note transcription with the patient, who gave verbal consent to proceed.  History of Present Illness   Francisco Andrews is a 38 year old male with diabetes who presents for a routine follow-up visit for diabetes and lipids.  Blood sugar levels have been stable, with readings typically in the mid-100s to around 180-181 mg/dL, particularly in the mornings. He is currently taking Rybelsus  and metformin , with the latter taken in the morning with breakfast. No gastrointestinal issues such as stomach problems, cramping, or constipation.  No chest pain, heart flutters, or shortness of breath, except when exposed to heat. No numbness or tingling in toes or feet.  He has not visited an eye doctor in over a year but reports no issues with eyesight. He usually visits [facility] for eye care.     Lab Results  Component Value Date   HGBA1C 10.7 (A) 07/03/2023   Lab Results  Component Value Date   NA 137 08/16/2022   K 4.8 08/16/2022   CREATININE 1.21 08/16/2022   EGFR 80 08/16/2022   GLUCOSE 179 (H) 08/16/2022     Medications: Outpatient Medications Prior to Visit  Medication Sig   glucose blood (ONETOUCH VERIO) test strip Use to check blood sugar up to three times a day for uncontrolled type 2 diabetes (e11.65)   metFORMIN  (GLUCOPHAGE ) 1000 MG tablet Take 1 tablet (1,000 mg total) by mouth daily.   rosuvastatin  (CRESTOR ) 5 MG tablet TAKE 1 TABLET (5 MG TOTAL) BY MOUTH DAILY.   Semaglutide  (RYBELSUS ) 14 MG TABS Take 1 tablet (14 mg total) by mouth daily.   No facility-administered medications prior to  visit.   Review of Systems  Constitutional:  Negative for appetite change, chills and fever.  Respiratory:  Negative for chest tightness, shortness of breath and wheezing.   Cardiovascular:  Negative for chest pain and palpitations.  Gastrointestinal:  Negative for abdominal pain, nausea and vomiting.       Objective    BP (!) 143/81 (BP Location: Left Arm)   Pulse 74   Temp (!) 97.5 F (36.4 C) (Oral)   Ht 6' 1 (1.854 m)   Wt 220 lb 6.4 oz (100 kg)   SpO2 100%   BMI 29.08 kg/m   Physical Exam   General: Appearance:    Well developed, well nourished male in no acute distress  Eyes:    PERRL, conjunctiva/corneas clear, EOM's intact       Lungs:     Clear to auscultation bilaterally, respirations unlabored  Heart:    Normal heart rate. Normal rhythm. No murmurs, rubs, or gallops.    MS:   All extremities are intact.    Neurologic:   Awake, alert, oriented x 3. No apparent focal neurological defect.          Assessment & Plan       Type 2 diabetes mellitus without complications Diabetes well-controlled with Rybelsus  and metformin . No neuropathy or vision issues reported. Eye exam overdue. - Continue Rybelsus  and metformin . - Order A1c, cholesterol, and kidney function tests. - Advise  ophthalmology follow-up for eye exam.    He is tolerating rosuvastatin  well with no adverse effects.        Nancyann Perry, MD  [facility] [phone] (phone) [phone] (fax)  [facility]

## 2023-09-29 LAB — COMPREHENSIVE METABOLIC PANEL WITH GFR
ALT: 48 IU/L — ABNORMAL HIGH (ref 0–44)
AST: 33 IU/L (ref 0–40)
Albumin: 5 g/dL (ref 4.1–5.1)
Alkaline Phosphatase: 106 IU/L (ref 44–121)
BUN/Creatinine Ratio: 13 (ref 9–20)
BUN: 17 mg/dL (ref 6–20)
Bilirubin Total: 0.5 mg/dL (ref 0.0–1.2)
CO2: 20 mmol/L (ref 20–29)
Calcium: 9.8 mg/dL (ref 8.7–10.2)
Chloride: 101 mmol/L (ref 96–106)
Creatinine, Ser: 1.32 mg/dL — ABNORMAL HIGH (ref 0.76–1.27)
Globulin, Total: 2.9 g/dL (ref 1.5–4.5)
Glucose: 132 mg/dL — ABNORMAL HIGH (ref 70–99)
Potassium: 4.1 mmol/L (ref 3.5–5.2)
Sodium: 141 mmol/L (ref 134–144)
Total Protein: 7.9 g/dL (ref 6.0–8.5)
eGFR: 72 mL/min/1.73 (ref >59)

## 2023-09-29 LAB — LIPID PANEL
Chol/HDL Ratio: 4.4 ratio (ref 0.0–5.0)
Cholesterol, Total: 172 mg/dL (ref 100–199)
HDL: 39 mg/dL — ABNORMAL LOW (ref >39)
LDL Chol Calc (NIH): 102 mg/dL — ABNORMAL HIGH (ref 0–99)
Triglycerides: 179 mg/dL — ABNORMAL HIGH (ref 0–149)
VLDL Cholesterol Cal: 31 mg/dL (ref 5–40)

## 2023-09-29 LAB — HEMOGLOBIN A1C
Est. average glucose Bld gHb Est-mCnc: 209 mg/dL
Hgb A1c MFr Bld: 8.9 % — ABNORMAL HIGH (ref 4.8–5.6)

## 2023-09-30 ENCOUNTER — Ambulatory Visit: Payer: Self-pay | Admitting: Family Medicine

## 2023-09-30 DIAGNOSIS — E1165 Type 2 diabetes mellitus with hyperglycemia: Secondary | ICD-10-CM

## 2023-09-30 MED ORDER — RYBELSUS 14 MG PO TABS: 14.0000 mg | ORAL_TABLET | Freq: Every day | ORAL | 1 refills | Status: AC

## 2023-09-30 MED ORDER — METFORMIN HCL 1000 MG PO TABS: 1000.0000 mg | ORAL_TABLET | Freq: Two times a day (BID) | ORAL | 1 refills | Status: DC

## 2023-12-13 ENCOUNTER — Other Ambulatory Visit: Payer: Self-pay | Admitting: Family Medicine

## 2023-12-13 DIAGNOSIS — E1169 Type 2 diabetes mellitus with other specified complication: Secondary | ICD-10-CM

## 2023-12-31 ENCOUNTER — Encounter: Payer: Self-pay | Admitting: Family Medicine

## 2023-12-31 ENCOUNTER — Ambulatory Visit: Admitting: Family Medicine

## 2023-12-31 VITALS — BP 129/85 | HR 85 | Resp 14 | Ht 73.0 in | Wt 215.8 lb

## 2023-12-31 DIAGNOSIS — E1169 Type 2 diabetes mellitus with other specified complication: Secondary | ICD-10-CM | POA: Diagnosis not present

## 2023-12-31 DIAGNOSIS — E785 Hyperlipidemia, unspecified: Secondary | ICD-10-CM

## 2023-12-31 DIAGNOSIS — Z23 Encounter for immunization: Secondary | ICD-10-CM

## 2023-12-31 DIAGNOSIS — Z7984 Long term (current) use of oral hypoglycemic drugs: Secondary | ICD-10-CM

## 2023-12-31 LAB — POCT GLYCOSYLATED HEMOGLOBIN (HGB A1C)
Est. average glucose Bld gHb Est-mCnc: 157
Hemoglobin A1C: 7.1 % — AB (ref 4.0–5.6)

## 2023-12-31 NOTE — Patient Instructions (Signed)
 Francisco Andrews  Please review the attached list of medications and notify my office if there are any errors.   . Please bring all of your medications to every appointment so we can make sure that our medication list is the same as yours.

## 2023-12-31 NOTE — Progress Notes (Signed)
 Established patient visit   Patient: Francisco Andrews   DOB: Dec 02, 1985   38 y.o. Male  MRN: [id number] Visit Date: 12/31/2023  Today's healthcare provider: Nancyann Perry, MD   Chief Complaint  Patient presents with   Medical Management of Chronic Issues    Reports doing good, taking medication as directed.  No eye exam.   Subjective    Discussed the use of AI scribe software for clinical note transcription with the patient, who gave verbal consent to proceed.  History of Present Illness   Francisco Andrews is a 38 year old male with type 2 diabetes who presents for follow-up to check on blood sugar and A1c levels.  He has been taking Rybelsus  14 mg daily and metformin  twice a day, with no side effects from Rybelsus . His A1c has improved to 7.1.  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 He often goes the entire day with minimal food intake, typically having only a cup of black coffee and a small snack like almonds in the morning, and not eating again until late afternoon. This irregular eating pattern is a challenge for managing his diabetes. 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 Previously, when he had a dedicated office, he was able to meal prep more effectively with access to a mini fridge.  He has been actively working on [employer], having lost 6-7 pounds since his last visit. He aims to lose an additional 10 pounds to reach a target weight of around 200 pounds. He has been frequenting the gym more often to aid in weight loss and improve his overall health.     Lab Results  Component Value Date   HGBA1C 7.1 (A) 12/31/2023   HGBA1C 8.9 (H) 09/28/2023   HGBA1C 10.7 (A) 07/03/2023   Lab Results  Component Value Date   CHOL 172 09/28/2023   HDL 39 (L) 09/28/2023   LDLCALC 102 (H) 09/28/2023   LDLDIRECT 114 (H) 08/16/2022   TRIG 179  (H) 09/28/2023   CHOLHDL 4.4 09/28/2023   Wt Readings from Last 3 Encounters:  12/31/23 215 lb 12.8 oz (97.9 kg)  09/28/23 220 lb 6.4 oz (100 kg)  07/03/23 226 lb 12.8 oz (102.9 kg)     Medications: Outpatient Medications Prior to Visit  Medication Sig   glucose blood (ONETOUCH VERIO) test strip Use to check blood sugar up to three times a day for uncontrolled type 2 diabetes (e11.65)   metFORMIN  (GLUCOPHAGE ) 1000 MG tablet Take 1 tablet (1,000 mg total) by mouth 2 (two) times daily with a meal.   rosuvastatin  (CRESTOR ) 5 MG tablet TAKE 1 TABLET (5 MG TOTAL) BY MOUTH DAILY.   Semaglutide  (RYBELSUS ) 14 MG TABS Take 1 tablet (14 mg total) by mouth daily.   No facility-administered medications prior to visit.   Review of Systems     Objective    BP 129/85   Pulse 85   Resp 14   Ht 6' 1 (1.854 m)   Wt 215 lb 12.8 oz (97.9 kg)   SpO2 99%   BMI 28.47 kg/m   Physical Exam   General appearance: Well developed, well nourished male, cooperative and in no acute distress Head: Normocephalic, without obvious abnormality, atraumatic Respiratory: Respirations even and unlabored, normal respiratory rate Extremities: All extremities are intact.  Skin: Skin color, texture, turgor normal. No rashes  seen  Psych: Appropriate mood and affect. Neurologic: Mental status: Alert, oriented to person, place, and time, thought content appropriate.   Results for orders placed or performed in visit on 12/31/23  POCT glycosylated hemoglobin (Hb A1C)  Result Value Ref Range   Hemoglobin A1C 7.1 (A) 4.0 - 5.6 %   Est. average glucose Bld gHb Est-mCnc 157      Assessment & Plan    1. Type 2 diabetes mellitus with hyperlipidemia (HCC) (Primary) A1c much better with since going back up to BI metformin  and improving diet. He is working on losing weight.   He is tolerating rosuvastatin  well with no adverse effects.    2. Need for prophylactic vaccination using tetanus and diphtheria toxoids  adsorbed (Td) vaccine  - Tdap vaccine greater than or equal to 7yo IM  Return in about 6 months (around 06/29/2024) for Yearly Physical.     Nancyann Perry, MD  [facility] [phone] (phone) [phone] (fax)  [facility]

## 2024-03-13 ENCOUNTER — Other Ambulatory Visit: Payer: Self-pay | Admitting: Family Medicine

## 2024-03-13 DIAGNOSIS — E1165 Type 2 diabetes mellitus with hyperglycemia: Secondary | ICD-10-CM

## 2024-06-30 ENCOUNTER — Encounter: Admitting: Family Medicine
